# Patient Record
Sex: Female | Born: 1970 | Race: White | Hispanic: No | Marital: Married | Smoking: Former smoker
Health system: Southern US, Community
[De-identification: ages and names within clinical notes are randomized; demographics above are authoritative.]

## PROBLEM LIST (undated history)

## (undated) DIAGNOSIS — R519 Headache, unspecified: Secondary | ICD-10-CM

## (undated) DIAGNOSIS — F419 Anxiety disorder, unspecified: Secondary | ICD-10-CM

## (undated) DIAGNOSIS — R0600 Dyspnea, unspecified: Secondary | ICD-10-CM

## (undated) HISTORY — PX: CHOLECYSTECTOMY: SHX55

## (undated) HISTORY — PX: TONSILLECTOMY: SUR1361

---

## 2005-05-14 ENCOUNTER — Ambulatory Visit: Payer: Self-pay | Admitting: Obstetrics and Gynecology

## 2005-11-19 ENCOUNTER — Ambulatory Visit: Payer: Self-pay | Admitting: Family Medicine

## 2010-10-24 DIAGNOSIS — L409 Psoriasis, unspecified: Secondary | ICD-10-CM | POA: Insufficient documentation

## 2010-10-24 DIAGNOSIS — M542 Cervicalgia: Secondary | ICD-10-CM | POA: Insufficient documentation

## 2010-10-24 DIAGNOSIS — K219 Gastro-esophageal reflux disease without esophagitis: Secondary | ICD-10-CM | POA: Insufficient documentation

## 2010-10-24 DIAGNOSIS — F419 Anxiety disorder, unspecified: Secondary | ICD-10-CM | POA: Insufficient documentation

## 2010-10-24 DIAGNOSIS — F410 Panic disorder [episodic paroxysmal anxiety] without agoraphobia: Secondary | ICD-10-CM | POA: Insufficient documentation

## 2011-04-21 DIAGNOSIS — R0600 Dyspnea, unspecified: Secondary | ICD-10-CM | POA: Insufficient documentation

## 2011-04-21 DIAGNOSIS — L508 Other urticaria: Secondary | ICD-10-CM | POA: Insufficient documentation

## 2013-03-19 ENCOUNTER — Ambulatory Visit: Payer: Self-pay | Admitting: Physician Assistant

## 2013-03-20 ENCOUNTER — Ambulatory Visit: Payer: Self-pay | Admitting: Physician Assistant

## 2013-03-22 ENCOUNTER — Ambulatory Visit: Payer: Self-pay | Admitting: Physician Assistant

## 2015-07-04 ENCOUNTER — Other Ambulatory Visit: Payer: Self-pay | Admitting: Obstetrics and Gynecology

## 2015-07-04 DIAGNOSIS — Z1231 Encounter for screening mammogram for malignant neoplasm of breast: Secondary | ICD-10-CM

## 2015-07-19 ENCOUNTER — Ambulatory Visit: Payer: Self-pay

## 2015-12-17 DIAGNOSIS — M940 Chondrocostal junction syndrome [Tietze]: Secondary | ICD-10-CM | POA: Insufficient documentation

## 2016-07-16 ENCOUNTER — Ambulatory Visit: Payer: BLUE CROSS/BLUE SHIELD | Admitting: Physical Therapy

## 2016-07-24 ENCOUNTER — Encounter: Payer: BLUE CROSS/BLUE SHIELD | Admitting: Physical Therapy

## 2016-07-30 ENCOUNTER — Encounter: Payer: BLUE CROSS/BLUE SHIELD | Admitting: Physical Therapy

## 2016-08-04 ENCOUNTER — Encounter: Payer: BLUE CROSS/BLUE SHIELD | Admitting: Physical Therapy

## 2016-08-18 ENCOUNTER — Encounter: Payer: BLUE CROSS/BLUE SHIELD | Admitting: Physical Therapy

## 2016-09-01 ENCOUNTER — Encounter: Payer: BLUE CROSS/BLUE SHIELD | Admitting: Physical Therapy

## 2016-09-15 ENCOUNTER — Encounter: Payer: BLUE CROSS/BLUE SHIELD | Admitting: Physical Therapy

## 2016-09-30 ENCOUNTER — Encounter: Payer: BLUE CROSS/BLUE SHIELD | Admitting: Physical Therapy

## 2016-10-28 ENCOUNTER — Ambulatory Visit
Admission: RE | Admit: 2016-10-28 | Payer: BLUE CROSS/BLUE SHIELD | Source: Ambulatory Visit | Admitting: Gastroenterology

## 2016-10-28 ENCOUNTER — Encounter: Admission: RE | Payer: Self-pay | Source: Ambulatory Visit

## 2016-10-28 SURGERY — ESOPHAGOGASTRODUODENOSCOPY (EGD) WITH PROPOFOL
Anesthesia: General

## 2016-11-04 ENCOUNTER — Other Ambulatory Visit: Payer: Self-pay | Admitting: Internal Medicine

## 2016-11-04 DIAGNOSIS — R1011 Right upper quadrant pain: Secondary | ICD-10-CM

## 2016-11-17 ENCOUNTER — Encounter
Admission: RE | Admit: 2016-11-17 | Discharge: 2016-11-17 | Disposition: A | Discharge status: Home / Self Care | Payer: BLUE CROSS/BLUE SHIELD | Source: Ambulatory Visit | Attending: Internal Medicine | Admitting: Internal Medicine

## 2016-11-17 DIAGNOSIS — R1011 Right upper quadrant pain: Secondary | ICD-10-CM

## 2016-11-17 MED ORDER — TECHNETIUM TC 99M MEBROFENIN IV KIT
5.0000 | PACK | Freq: Once | INTRAVENOUS | Status: AC | PRN
  Administered 2016-11-17: 5.091 via INTRAVENOUS

## 2017-01-07 ENCOUNTER — Other Ambulatory Visit: Payer: Self-pay | Admitting: Gastroenterology

## 2017-01-07 DIAGNOSIS — R1011 Right upper quadrant pain: Secondary | ICD-10-CM

## 2017-01-12 ENCOUNTER — Ambulatory Visit
Admission: RE | Admit: 2017-01-12 | Discharge: 2017-01-12 | Disposition: A | Discharge status: Home / Self Care | Payer: BLUE CROSS/BLUE SHIELD | Source: Ambulatory Visit | Attending: Gastroenterology | Admitting: Gastroenterology

## 2017-01-12 DIAGNOSIS — R1011 Right upper quadrant pain: Secondary | ICD-10-CM | POA: Diagnosis not present

## 2017-01-12 DIAGNOSIS — K760 Fatty (change of) liver, not elsewhere classified: Secondary | ICD-10-CM | POA: Insufficient documentation

## 2017-09-15 ENCOUNTER — Other Ambulatory Visit: Payer: Self-pay | Admitting: Obstetrics and Gynecology

## 2017-09-15 DIAGNOSIS — N6310 Unspecified lump in the right breast, unspecified quadrant: Secondary | ICD-10-CM

## 2017-10-05 ENCOUNTER — Ambulatory Visit
Admission: RE | Admit: 2017-10-05 | Discharge: 2017-10-05 | Disposition: A | Discharge status: Home / Self Care | Payer: BLUE CROSS/BLUE SHIELD | Source: Ambulatory Visit | Attending: Obstetrics and Gynecology | Admitting: Obstetrics and Gynecology

## 2017-10-05 ENCOUNTER — Encounter: Payer: Self-pay | Admitting: Radiology

## 2017-10-05 DIAGNOSIS — N6310 Unspecified lump in the right breast, unspecified quadrant: Secondary | ICD-10-CM | POA: Diagnosis present

## 2018-05-03 ENCOUNTER — Ambulatory Visit
Admission: EM | Admit: 2018-05-03 | Discharge: 2018-05-03 | Disposition: A | Discharge status: Home / Self Care | Payer: BLUE CROSS/BLUE SHIELD | Attending: Family Medicine | Admitting: Family Medicine

## 2018-05-03 ENCOUNTER — Other Ambulatory Visit: Payer: Self-pay

## 2018-05-03 ENCOUNTER — Encounter: Payer: Self-pay | Admitting: Emergency Medicine

## 2018-05-03 ENCOUNTER — Ambulatory Visit (INDEPENDENT_AMBULATORY_CARE_PROVIDER_SITE_OTHER): Payer: BLUE CROSS/BLUE SHIELD

## 2018-05-03 DIAGNOSIS — M940 Chondrocostal junction syndrome [Tietze]: Secondary | ICD-10-CM

## 2018-05-03 DIAGNOSIS — R05 Cough: Secondary | ICD-10-CM

## 2018-05-03 DIAGNOSIS — R1010 Upper abdominal pain, unspecified: Secondary | ICD-10-CM

## 2018-05-03 DIAGNOSIS — R0602 Shortness of breath: Secondary | ICD-10-CM | POA: Diagnosis not present

## 2018-05-03 DIAGNOSIS — F411 Generalized anxiety disorder: Secondary | ICD-10-CM

## 2018-05-03 DIAGNOSIS — Z87891 Personal history of nicotine dependence: Secondary | ICD-10-CM

## 2018-05-03 HISTORY — DX: Anxiety disorder, unspecified: F41.9

## 2018-05-03 MED ORDER — PREDNISONE 10 MG (21) PO TBPK: ORAL_TABLET | Freq: Every day | ORAL | 0 refills | Status: DC

## 2018-05-03 NOTE — ED Provider Notes (Signed)
[facility]    CSN: [id number] Arrival date & time: 05/03/18  1254     History   Chief Complaint Chief Complaint  Patient presents with  . Shortness of Breath    APPT  . Cough    HPI Loretta Dalton is a 48 y.o. female.   48 year old female presents with shortness of breath that she has noticed over the past week. Feels like she has pressure just above her abdomen and along her ribs that is causing her to have difficulty breathing. Shortness of breath is worse when she lies down or leans over. Denies any fever, wheezing, upper chest pain, or swelling in her legs. No dizziness or syncope. She does have a history of fatty liver and gallbladder disease and uncertain if her certain symptoms are related. She has not taken any medications for symptoms. No history of asthma. No previous inhaler use. Has had an intermittent cough for over 3 months. Does have a history of costochondritis, intermittent chest pain that she has had and been evaluated for multiple times in the past 7 years and anxiety. Has been recommended for stress tests and further evaluation but patient either refuses due to financial reasons or anxiety. Has used prednisone in the past for similar symptoms with success. Former smoker (quit 5 years ago). No HTN but elevated cholesterol level. Father has history of CAD in his 1's. Mom with heart failure. Patient under increased stress lately due to home situation and fear of Coronavirus.   The history is provided by the patient.    Past Medical History:  Diagnosis Date  . Anxiety     There are no active problems to display for this patient.   Past Surgical History:  Procedure Laterality Date  . CESAREAN SECTION    . TONSILLECTOMY      OB History   No obstetric history on file.      Home Medications    Prior to Admission medications   Medication Sig Start Date End Date Taking? Authorizing Provider  predniSONE (STERAPRED UNI-PAK 21 TAB) 10 MG (21)  TBPK tablet Take by mouth daily. Take 6 tabs by mouth daily  for 2 days, then decrease by 1 tablet every 2 days until finished on day 12. 05/03/18   Andrell Tallman, Nicholes Stairs, NP    Family History Family History  Problem Relation Age of Onset  . Irritable bowel syndrome Mother   . Heart failure Mother   . Supraventricular tachycardia Mother   . Hyperlipidemia Mother   . Heart attack Father 52  . Breast cancer Neg Hx     Social History Social History   Tobacco Use  . Smoking status: Former Smoker    Last attempt to quit: 05/03/2003    Years since quitting: 15.0  . Smokeless tobacco: Never Used  Substance Use Topics  . Alcohol use: Yes    Alcohol/week: 12.0 standard drinks    Types: 12 Cans of beer per week  . Drug use: Never     Allergies   Augmentin [amoxicillin-pot clavulanate] and Neosporin scar solution [silicone]   Review of Systems Review of Systems  Constitutional: Positive for activity change and fatigue. Negative for appetite change, chills, diaphoresis, fever and unexpected weight change.  HENT: Negative for congestion, ear discharge, ear pain, facial swelling, mouth sores, nosebleeds, postnasal drip, rhinorrhea, sinus pressure, sinus pain, sneezing, sore throat and trouble swallowing.   Eyes: Negative for photophobia and visual disturbance.  Respiratory: Positive for cough and  shortness of breath. Negative for chest tightness, wheezing and stridor.   Cardiovascular: Positive for chest pain (lower). Negative for palpitations and leg swelling.  Gastrointestinal: Positive for abdominal pain (central upper). Negative for diarrhea, nausea and vomiting.  Genitourinary: Negative for decreased urine volume, difficulty urinating, frequency and hematuria.  Musculoskeletal: Negative for arthralgias, back pain, joint swelling and myalgias.  Skin: Positive for rash (recurrent psoriosis). Negative for color change and wound.  Neurological: Negative for dizziness, tremors, seizures,  syncope, speech difficulty, weakness, light-headedness, numbness and headaches.  Hematological: Negative for adenopathy. Does not bruise/bleed easily.  Psychiatric/Behavioral: The patient is nervous/anxious.      Physical Exam Triage Vital Signs ED Triage Vitals  Enc Vitals Group     BP 05/03/18 1310 133/88     Pulse Rate 05/03/18 1310 94     Resp 05/03/18 1310 16     Temp 05/03/18 1310 97.8 F (36.6 C)     Temp Source 05/03/18 1310 Oral     SpO2 05/03/18 1310 100 %     Weight 05/03/18 1311 180 lb (81.6 kg)     Height 05/03/18 1311 5\' 1"  (1.549 m)     Head Circumference --      Peak Flow --      Pain Score 05/03/18 1309 3     Pain Loc --      Pain Edu? --      Excl. in [place]? --    No data found.  Updated Vital Signs BP 133/88 (BP Location: Left Arm)   Pulse 94   Temp 97.8 F (36.6 C) (Oral)   Resp 16   Ht 5\' 1"  (1.549 m)   Wt 180 lb (81.6 kg)   LMP 02/01/2018   SpO2 100%   BMI 34.01 kg/m   Visual Acuity Right Eye Distance:   Left Eye Distance:   Bilateral Distance:    Right Eye Near:   Left Eye Near:    Bilateral Near:     Physical Exam Vitals signs and nursing note reviewed.  Constitutional:      General: She is awake. She is not in acute distress.    Appearance: She is well-developed, well-groomed and overweight. She is not ill-appearing.     Comments: Patient resting comfortably on exam table in no acute distress but appears anxious  HENT:     Head: Normocephalic and atraumatic.     Right Ear: Hearing, tympanic membrane, ear canal and external ear normal.     Left Ear: Hearing, tympanic membrane, ear canal and external ear normal.     Nose: Nose normal.     Mouth/Throat:     Lips: Pink.     Mouth: Mucous membranes are moist.     Pharynx: Oropharynx is clear. Uvula midline. No pharyngeal swelling, oropharyngeal exudate, posterior oropharyngeal erythema or uvula swelling.  Eyes:     Extraocular Movements: Extraocular movements intact.      Conjunctiva/sclera: Conjunctivae normal.     Pupils: Pupils are equal, round, and reactive to light.  Neck:     Musculoskeletal: Normal range of motion and neck supple. No neck rigidity.     Vascular: Normal carotid pulses. No carotid bruit, hepatojugular reflux or JVD.     Trachea: Trachea normal.  Cardiovascular:     Rate and Rhythm: Normal rate and regular rhythm.     Pulses: Normal pulses.     Heart sounds: Normal heart sounds. No murmur.  Pulmonary:     Effort: Pulmonary effort is  normal. No accessory muscle usage, respiratory distress or retractions.     Breath sounds: Normal breath sounds and air entry. No stridor or decreased air movement. No decreased breath sounds, wheezing, rhonchi or rales.  Chest:     Chest wall: Tenderness (bilateral along lower rib cage) present.    Abdominal:     General: Bowel sounds are normal. There is no distension or abdominal bruit.     Palpations: Abdomen is soft. There is no mass.     Tenderness: There is abdominal tenderness in the right upper quadrant and epigastric area. There is no right CVA tenderness, left CVA tenderness, guarding or rebound.     Hernia: No hernia is present.  Musculoskeletal: Normal range of motion.  Lymphadenopathy:     Cervical: No cervical adenopathy.  Skin:    General: Skin is warm and dry.     Capillary Refill: Capillary refill takes less than 2 seconds.  Neurological:     General: No focal deficit present.     Mental Status: She is alert and oriented to person, place, and time.     Cranial Nerves: Cranial nerves are intact.     Sensory: Sensation is intact.     Motor: Motor function is intact.     Gait: Gait is intact.  Psychiatric:        Attention and Perception: Attention normal.        Mood and Affect: Mood is anxious. Affect is tearful.        Speech: Speech normal.        Behavior: Behavior is cooperative.        Thought Content: Thought content normal. Thought content does not include homicidal or  suicidal ideation.        Cognition and Memory: Cognition normal.      UC Treatments / Results  Labs (all labs ordered are listed, but only abnormal results are displayed) Labs Reviewed - No data to display  EKG None  Radiology Dg Chest 2 View  Result Date: 05/03/2018 CLINICAL DATA:  Short of breath and cough EXAM: CHEST - 2 VIEW COMPARISON:  None. FINDINGS: The heart size and mediastinal contours are within normal limits. Both lungs are clear. The visualized skeletal structures are unremarkable. IMPRESSION: No active cardiopulmonary disease. Electronically Signed   By: Franchot Gallo M.D.   On: 05/03/2018 14:02    Procedures Procedures (including critical care time)  Medications Ordered in UC Medications - No data to display  Initial Impression / Assessment and Plan / UC Course  I have reviewed the triage vital signs and the nursing notes.  Pertinent labs & imaging results that were available during my care of the patient were reviewed by me and considered in my medical decision making (see chart for details).    Reviewed chest x-ray results with patient- no fluid, pneumonia or enlarged heart. Discussed that symptoms may be costochondritis with anxiety component. Doubt cardiac etiology but patient should have further work-up though her PCP. Recommend trial Prednisone 10mg  12 day dose pack as directed. Offered to provide her with Vistaril to take prn for anxiety but patient got very nervous and "did not want to take any new medication. If similar to Benadryl, it makes me mean and I don't feel right". Also uncertain if limited exercise and poor conditioning is contributing to her shortness of breath. Encouraged to eat more fruits and vegetables and less fried and fatty foods to help with occasional abdominal pain. Encouraged to try to slowly  increase walking and exercise. Recommend relaxation techniques to deal with anxiety. Note written for work for today. Follow-up with her PCP in 3  to 4 days if minimal improvement or go to the ER ASAP if symptoms worsen.  Final Clinical Impressions(s) / UC Diagnoses   Final diagnoses:  Shortness of breath  Costochondritis  Anxiety state  Intermittent upper abdominal pain     Discharge Instructions     Recommend start Prednisone 10mg  tablets- take 6 tablets today and tomorrow and then decrease by 1 tablet every 2 days until finished on day 12. Continue to use relaxation techniques to help deal with anxiety. Recommend follow-up with your PCP if not improving within 3 to 4 days or go to the ER ASAP if symptoms worsen.     ED Prescriptions    Medication Sig Dispense Auth. Provider   predniSONE (STERAPRED UNI-PAK 21 TAB) 10 MG (21) TBPK tablet Take by mouth daily. Take 6 tabs by mouth daily  for 2 days, then decrease by 1 tablet every 2 days until finished on day 12. 42 tablet Faiga Stones, Nicholes Stairs, NP     Controlled Substance Prescriptions [place] Controlled Substance Registry consulted? Not Applicable   Katy Apo, NP 05/04/18 1133

## 2018-05-03 NOTE — ED Triage Notes (Signed)
Patient in today c/o sob x 1 week off & on x 1 week. Patient states her anxiety makes her sob worse. Patient does have a slight cough from illness at Christmas time. Patient c/o rib pain and abdominal pain. Patient has a history of costochondritis.

## 2018-05-03 NOTE — Discharge Instructions (Addendum)
Recommend start Prednisone 10mg  tablets- take 6 tablets today and tomorrow and then decrease by 1 tablet every 2 days until finished on day 12. Continue to use relaxation techniques to help deal with anxiety. Recommend follow-up with your PCP if not improving within 3 to 4 days or go to the ER ASAP if symptoms worsen.

## 2019-02-03 ENCOUNTER — Other Ambulatory Visit: Payer: Self-pay | Admitting: Physician Assistant

## 2019-02-03 DIAGNOSIS — Z1231 Encounter for screening mammogram for malignant neoplasm of breast: Secondary | ICD-10-CM

## 2019-03-03 ENCOUNTER — Other Ambulatory Visit: Payer: Self-pay

## 2019-03-03 ENCOUNTER — Ambulatory Visit
Admission: RE | Admit: 2019-03-03 | Discharge: 2019-03-03 | Disposition: A | Discharge status: Home / Self Care | Payer: 59 | Source: Ambulatory Visit | Attending: Physician Assistant | Admitting: Physician Assistant

## 2019-03-03 DIAGNOSIS — Z1231 Encounter for screening mammogram for malignant neoplasm of breast: Secondary | ICD-10-CM | POA: Diagnosis present

## 2019-08-02 ENCOUNTER — Other Ambulatory Visit: Payer: Self-pay | Admitting: Gastroenterology

## 2019-08-02 DIAGNOSIS — R112 Nausea with vomiting, unspecified: Secondary | ICD-10-CM

## 2019-08-02 DIAGNOSIS — K760 Fatty (change of) liver, not elsewhere classified: Secondary | ICD-10-CM

## 2019-08-10 ENCOUNTER — Ambulatory Visit
Admission: RE | Admit: 2019-08-10 | Discharge: 2019-08-10 | Disposition: A | Discharge status: Home / Self Care | Payer: 59 | Source: Ambulatory Visit | Attending: Gastroenterology | Admitting: Gastroenterology

## 2019-08-10 ENCOUNTER — Encounter
Admission: RE | Admit: 2019-08-10 | Discharge: 2019-08-10 | Disposition: A | Discharge status: Home / Self Care | Payer: 59 | Source: Ambulatory Visit | Attending: Gastroenterology | Admitting: Gastroenterology

## 2019-08-10 ENCOUNTER — Other Ambulatory Visit: Payer: Self-pay

## 2019-08-10 DIAGNOSIS — K760 Fatty (change of) liver, not elsewhere classified: Secondary | ICD-10-CM | POA: Insufficient documentation

## 2019-08-10 DIAGNOSIS — R112 Nausea with vomiting, unspecified: Secondary | ICD-10-CM | POA: Diagnosis not present

## 2019-08-10 MED ORDER — TECHNETIUM TC 99M MEBROFENIN IV KIT
5.0000 | PACK | Freq: Once | INTRAVENOUS | Status: AC | PRN
  Administered 2019-08-10: 5.31 via INTRAVENOUS

## 2019-08-24 ENCOUNTER — Ambulatory Visit: Payer: Self-pay | Admitting: Surgery

## 2019-08-24 NOTE — H&P (View-Only) (Signed)
Subjective:   CC: Biliary dyskinesia [K82.8]  HPI:  Loretta Dalton is a 49 y.o. female who was referred by Fabian Sharp* for evaluation of above CC. Symptoms were first noted several weeks ago. Pain is sharp and intermittent, confined to the epigastric area and right upper quadrant. Associated with nothing specific, exacerbated by nothing specific.  Noticed increased SOB, but associated with increasing anxiety lately.  Discussed with PCM who also believed it was anxiety related, did not order further workup.  Also complained of increasing epigastric discomfort not alleviated by recent PPI use.     Past Medical History:  has a past medical history of Abnormal Pap smear (49 year old), Fatty liver, GERD (gastroesophageal reflux disease), and Psoriasis.  Past Surgical History:  has a past surgical history that includes Tonsillectomy; Cesarean section (2003); and egd (11/03/2016).  Family History: family history includes Colon cancer in her maternal uncle; Coronary Artery Disease (Blocked arteries around heart) in her father; Heart disease in her father and paternal grandmother; Irritable bowel syndrome in her mother; Liver cancer in her maternal uncle; Prostate cancer in her maternal grandfather; Supraventricular tachycardia in her mother.  Social History:  reports that she quit smoking about 20 years ago. She has a 5.00 pack-year smoking history. She has never used smokeless tobacco. She reports current alcohol use of about 3.0 - 4.0 standard drinks of alcohol per week. She reports that she does not use drugs.  Current Medications: has a current medication list which includes the following prescription(s): acetylcysteine, alprazolam, ascorbic acid/collagen hydr, calcium carbonate-vitamin d3, clobetasol, cyanocobalamin, cyclobenzaprine hcl, etodolac, herbal supplement, magnesium oxide, milk thistle, pantoprazole, turmeric, vitamin e, and escitalopram oxalate.  Allergies:  Allergies as  of 08/24/2019 - Reviewed 08/24/2019  Allergen Reaction Noted  . Neosporin [hydrocortisone] Rash 10/25/2010  . Celebrex [celecoxib] Other (See Comments) 05/05/2019  . Augmentin [amoxicillin-pot clavulanate] Vomiting   . Silicone Rash 53/66/4403    ROS:  A 15 point review of systems was performed and pertinent positives and negatives noted in HPI    Objective:     BP 115/86   Pulse 93   Ht 156.2 cm (5' 1.5")   Wt 85.3 kg (188 lb)   BMI 34.95 kg/m    Constitutional :  alert, appears stated age, cooperative and no distress  Lymphatics/Throat:  no asymmetry, masses, or scars  Respiratory:  clear to auscultation bilaterally  Cardiovascular:  regular rate and rhythm  Gastrointestinal: soft, non-tender; bowel sounds normal; no masses,  no organomegaly.    Musculoskeletal: Steady gait and movement  Skin: Cool and moist  Psychiatric: Normal affect, non-agitated, not confused       LABS:  n/a   RADS: CLINICAL DATA: Abdominal discomfort and nausea and vomiting for  approximately 6 weeks.   EXAM:  NUCLEAR MEDICINE HEPATOBILIARY IMAGING WITH GALLBLADDER EF   TECHNIQUE:  Sequential images of the abdomen were obtained out to 60 minutes  following intravenous administration of radiopharmaceutical. After  oral ingestion of Ensure, gallbladder ejection fraction was  determined. At 60 min, normal ejection fraction is greater than 33%.   RADIOPHARMACEUTICALS: 5.3 mCi Tc-18m Choletec IV   COMPARISON: None.   FINDINGS:  Prompt uptake and biliary excretion of activity by the liver is  seen. Gallbladder activity is visualized, consistent with patency of  cystic duct. Biliary activity passes into small bowel, consistent  with patent common bile duct.   Calculated gallbladder ejection fraction is 8%. (Normal gallbladder  ejection fraction with Ensure is greater than  33%.)   IMPRESSION:  Normal hepatobiliary scan, demonstrating patency of cystic and  common bile ducts.    Decreased gallbladder ejection fraction of 8%.    Electronically Signed  By: Marlaine Hind M.D.  On: 08/10/2019 17:16  CLINICAL DATA: Fatty liver, nausea and vomiting   EXAM:  ABDOMEN ULTRASOUND COMPLETE   COMPARISON: 01/12/2017   FINDINGS:  Gallbladder: No gallstones or wall thickening visualized. No  sonographic Murphy sign noted by sonographer.   Common bile duct: Diameter: 2.4 mm   Liver: Heterogeneous increased echogenicity compatible with hepatic  steatosis. No focal abnormality or intrahepatic biliary dilatation.  Portal vein is patent on color Doppler imaging with normal direction  of blood flow towards the liver.   IVC: No abnormality visualized.   Pancreas: Visualized portion unremarkable.   Spleen: Size and appearance within normal limits.   Right Kidney: Length: 10.2 cm. Normal echogenicity. No  hydronephrosis. Right kidney upper pole exophytic hypoechoic  indeterminate lesion noted measuring 3.2 x 2.3 x 2.2 cm. This may  represent a complex cyst versus solid lesion. Recommend further  evaluation with nonemergent MRI.   Left Kidney: Length: 10.5 cm. Echogenicity within normal limits. No  mass or hydronephrosis visualized.   Abdominal aorta: No aneurysm visualized.   Other findings: No free fluid or ascites   IMPRESSION:  No acute finding by abdominal ultrasound.   Hepatic steatosis   3.2 cm hypoechoic right upper pole renal lesion remains  indeterminate by ultrasound. Recommend further evaluation with  nonemergent MRI without and with contrast.    Electronically Signed  By: Jerilynn Mages. Shick M.D.  On: 08/11/2019 09:15  Assessment:      Biliary dyskinesia [K82.8] symptoms sounds consistent with GB disease.  RUQ Symptoms likely will resolve, hoping her epigastric and "stomach ulcer" issues could potentially be alleviated as well, but no guarantee.  Plan:     1. Biliary dyskinesia [K82.8] Discussed the risk of surgery including post-op  infxn, seroma, biloma, chronic pain, poor-delayed wound healing, retained gallstone, conversion to open procedure, post-op SBO or ileus, and need for additional procedures to address said risks.  The risks of general anesthetic including MI, CVA, sudden death or even reaction to anesthetic medications also discussed. Alternatives include continued observation.  Benefits include possible symptom relief, prevention of complications including acute cholecystitis, pancreatitis.  Typical post operative recovery of 3-5 days rest, continued pain in area and incision sites, possible loose stools up to 4-6 weeks, also discussed.  ED return precautions given for sudden increase in RUQ pain, with possible accompanying fever, nausea, and/or vomiting.  The patient understands the risks, any and all questions were answered to the patient's satisfaction.  2. Patient has elected to proceed with surgical treatment. Procedure will be scheduled.  Written consent was obtained..robotic assisted laparoscopic.  Three days off work should be all she needs.  Pt anxious of procedure and requested additional time off, but reassured her three days typically is enough

## 2019-08-24 NOTE — H&P (Signed)
Subjective:   CC: Biliary dyskinesia [K82.8]  HPI:  Loretta Dalton is a 49 y.o. female who was referred by Fabian Sharp* for evaluation of above CC. Symptoms were first noted several weeks ago. Pain is sharp and intermittent, confined to the epigastric area and right upper quadrant. Associated with nothing specific, exacerbated by nothing specific.  Noticed increased SOB, but associated with increasing anxiety lately.  Discussed with PCM who also believed it was anxiety related, did not order further workup.  Also complained of increasing epigastric discomfort not alleviated by recent PPI use.     Past Medical History:  has a past medical history of Abnormal Pap smear (49 year old), Fatty liver, GERD (gastroesophageal reflux disease), and Psoriasis.  Past Surgical History:  has a past surgical history that includes Tonsillectomy; Cesarean section (2003); and egd (11/03/2016).  Family History: family history includes Colon cancer in her maternal uncle; Coronary Artery Disease (Blocked arteries around heart) in her father; Heart disease in her father and paternal grandmother; Irritable bowel syndrome in her mother; Liver cancer in her maternal uncle; Prostate cancer in her maternal grandfather; Supraventricular tachycardia in her mother.  Social History:  reports that she quit smoking about 20 years ago. She has a 5.00 pack-year smoking history. She has never used smokeless tobacco. She reports current alcohol use of about 3.0 - 4.0 standard drinks of alcohol per week. She reports that she does not use drugs.  Current Medications: has a current medication list which includes the following prescription(s): acetylcysteine, alprazolam, ascorbic acid/collagen hydr, calcium carbonate-vitamin d3, clobetasol, cyanocobalamin, cyclobenzaprine hcl, etodolac, herbal supplement, magnesium oxide, milk thistle, pantoprazole, turmeric, vitamin e, and escitalopram oxalate.  Allergies:  Allergies as  of 08/24/2019 - Reviewed 08/24/2019  Allergen Reaction Noted  . Neosporin [hydrocortisone] Rash 10/25/2010  . Celebrex [celecoxib] Other (See Comments) 05/05/2019  . Augmentin [amoxicillin-pot clavulanate] Vomiting   . Silicone Rash 81/19/1478    ROS:  A 15 point review of systems was performed and pertinent positives and negatives noted in HPI    Objective:     BP 115/86   Pulse 93   Ht 156.2 cm (5' 1.5")   Wt 85.3 kg (188 lb)   BMI 34.95 kg/m    Constitutional :  alert, appears stated age, cooperative and no distress  Lymphatics/Throat:  no asymmetry, masses, or scars  Respiratory:  clear to auscultation bilaterally  Cardiovascular:  regular rate and rhythm  Gastrointestinal: soft, non-tender; bowel sounds normal; no masses,  no organomegaly.    Musculoskeletal: Steady gait and movement  Skin: Cool and moist  Psychiatric: Normal affect, non-agitated, not confused       LABS:  n/a   RADS: CLINICAL DATA: Abdominal discomfort and nausea and vomiting for  approximately 6 weeks.   EXAM:  NUCLEAR MEDICINE HEPATOBILIARY IMAGING WITH GALLBLADDER EF   TECHNIQUE:  Sequential images of the abdomen were obtained out to 60 minutes  following intravenous administration of radiopharmaceutical. After  oral ingestion of Ensure, gallbladder ejection fraction was  determined. At 60 min, normal ejection fraction is greater than 33%.   RADIOPHARMACEUTICALS: 5.3 mCi Tc-66m Choletec IV   COMPARISON: None.   FINDINGS:  Prompt uptake and biliary excretion of activity by the liver is  seen. Gallbladder activity is visualized, consistent with patency of  cystic duct. Biliary activity passes into small bowel, consistent  with patent common bile duct.   Calculated gallbladder ejection fraction is 8%. (Normal gallbladder  ejection fraction with Ensure is greater than  33%.)   IMPRESSION:  Normal hepatobiliary scan, demonstrating patency of cystic and  common bile ducts.    Decreased gallbladder ejection fraction of 8%.    Electronically Signed  By: Marlaine Hind M.D.  On: 08/10/2019 17:16  CLINICAL DATA: Fatty liver, nausea and vomiting   EXAM:  ABDOMEN ULTRASOUND COMPLETE   COMPARISON: 01/12/2017   FINDINGS:  Gallbladder: No gallstones or wall thickening visualized. No  sonographic Murphy sign noted by sonographer.   Common bile duct: Diameter: 2.4 mm   Liver: Heterogeneous increased echogenicity compatible with hepatic  steatosis. No focal abnormality or intrahepatic biliary dilatation.  Portal vein is patent on color Doppler imaging with normal direction  of blood flow towards the liver.   IVC: No abnormality visualized.   Pancreas: Visualized portion unremarkable.   Spleen: Size and appearance within normal limits.   Right Kidney: Length: 10.2 cm. Normal echogenicity. No  hydronephrosis. Right kidney upper pole exophytic hypoechoic  indeterminate lesion noted measuring 3.2 x 2.3 x 2.2 cm. This may  represent a complex cyst versus solid lesion. Recommend further  evaluation with nonemergent MRI.   Left Kidney: Length: 10.5 cm. Echogenicity within normal limits. No  mass or hydronephrosis visualized.   Abdominal aorta: No aneurysm visualized.   Other findings: No free fluid or ascites   IMPRESSION:  No acute finding by abdominal ultrasound.   Hepatic steatosis   3.2 cm hypoechoic right upper pole renal lesion remains  indeterminate by ultrasound. Recommend further evaluation with  nonemergent MRI without and with contrast.    Electronically Signed  By: Jerilynn Mages. Shick M.D.  On: 08/11/2019 09:15  Assessment:      Biliary dyskinesia [K82.8] symptoms sounds consistent with GB disease.  RUQ Symptoms likely will resolve, hoping her epigastric and "stomach ulcer" issues could potentially be alleviated as well, but no guarantee.  Plan:     1. Biliary dyskinesia [K82.8] Discussed the risk of surgery including post-op  infxn, seroma, biloma, chronic pain, poor-delayed wound healing, retained gallstone, conversion to open procedure, post-op SBO or ileus, and need for additional procedures to address said risks.  The risks of general anesthetic including MI, CVA, sudden death or even reaction to anesthetic medications also discussed. Alternatives include continued observation.  Benefits include possible symptom relief, prevention of complications including acute cholecystitis, pancreatitis.  Typical post operative recovery of 3-5 days rest, continued pain in area and incision sites, possible loose stools up to 4-6 weeks, also discussed.  ED return precautions given for sudden increase in RUQ pain, with possible accompanying fever, nausea, and/or vomiting.  The patient understands the risks, any and all questions were answered to the patient's satisfaction.  2. Patient has elected to proceed with surgical treatment. Procedure will be scheduled.  Written consent was obtained..robotic assisted laparoscopic.  Three days off work should be all she needs.  Pt anxious of procedure and requested additional time off, but reassured her three days typically is enough

## 2019-08-30 ENCOUNTER — Encounter
Admission: RE | Admit: 2019-08-30 | Discharge: 2019-08-30 | Disposition: A | Discharge status: Home / Self Care | Payer: 59 | Source: Ambulatory Visit | Attending: Surgery | Admitting: Surgery

## 2019-08-30 ENCOUNTER — Other Ambulatory Visit: Payer: Self-pay

## 2019-08-30 HISTORY — DX: Headache, unspecified: R51.9

## 2019-08-30 HISTORY — DX: Dyspnea, unspecified: R06.00

## 2019-08-30 NOTE — Patient Instructions (Signed)
Your procedure is scheduled on: Thursday September 01, 2019. Report to Day Surgery inside [facility] 2nd floor. To find out your arrival time please call [phone] between 1PM - 3PM on Wednesday August 31, 2019.  Remember: Instructions that are not followed completely may result in serious medical risk,  up to and including death, or upon the discretion of your surgeon and anesthesiologist your  surgery may need to be rescheduled.     _X__ 1. Do not eat food after midnight the night before your procedure.                 No gum chewing or hard candies. You may drink clear liquids up to 2 hours                 before you are scheduled to arrive for your surgery- DO not drink clear                 liquids within 2 hours of the start of your surgery.                 Clear Liquids include:  water, apple juice without pulp, clear Gatorade, G2 or                  Gatorade Zero (avoid Red/Purple/Blue), Black Coffee or Tea (Do not add                 anything to coffee or tea).  __X__2.  On the morning of surgery brush your teeth with toothpaste and water, you                may rinse your mouth with mouthwash if you wish.  Do not swallow any toothpaste of mouthwash.     _X__ 3.  No Alcohol for 24 hours before or after surgery.   _X__ 4.  Do Not Smoke or use e-cigarettes For 24 Hours Prior to Your Surgery.                 Do not use any chewable tobacco products for at least 6 hours prior to                 Surgery.  _X__  5.  Do not use any recreational drugs (marijuana, cocaine, heroin, ecstacy, MDMA or other)                For at least one week prior to your surgery.  Combination of these drugs with anesthesia                May have life threatening results.  __X__ 6.  Notify your doctor if there is any change in your medical condition      (cold, fever, infections).     Do not wear jewelry, make-up, hairpins, clips or nail polish. Do not wear lotions, powders, or  perfumes. You may wear deodorant. Do not shave 48 hours prior to surgery. Men may shave face and neck. Do not bring valuables to the hospital.    [facility] is not responsible for any belongings or valuables.  Contacts, dentures or bridgework may not be worn into surgery. Leave your suitcase in the car. After surgery it may be brought to your room. For patients admitted to the hospital, discharge time is determined by your treatment team.   Patients discharged the day of surgery will not be allowed to drive home.   Make arrangements for someone to be  with you for the first 24 hours of your Same Day Discharge.  __X__ Take these medicines the morning of surgery with A SIP OF WATER:    1. pantoprazole (PROTONIX) 40 MG   __X__ Use CHG Soap (or wipes) as directed  __X__ Stop Anti-inflammatories such as etodolac (LODINE), Ibuprofen, Aleve, Advil, naproxen, aspirin and or BC powders   __X__ Stop supplements until after surgery.    __X__ Do not start any herbal supplements before your surgery.

## 2019-08-31 ENCOUNTER — Other Ambulatory Visit
Admission: RE | Admit: 2019-08-31 | Discharge: 2019-08-31 | Disposition: A | Discharge status: Home / Self Care | Payer: 59 | Source: Ambulatory Visit | Attending: Surgery | Admitting: Surgery

## 2019-08-31 DIAGNOSIS — Z01812 Encounter for preprocedural laboratory examination: Secondary | ICD-10-CM | POA: Diagnosis present

## 2019-08-31 DIAGNOSIS — Z20822 Contact with and (suspected) exposure to covid-19: Secondary | ICD-10-CM | POA: Insufficient documentation

## 2019-08-31 LAB — SARS CORONAVIRUS 2 (TAT 6-24 HRS): SARS Coronavirus 2: NEGATIVE

## 2019-09-01 ENCOUNTER — Encounter: Payer: Self-pay | Admitting: Surgery

## 2019-09-01 ENCOUNTER — Ambulatory Visit: Payer: 59 | Admitting: Certified Registered Nurse Anesthetist

## 2019-09-01 ENCOUNTER — Encounter
Admission: RE | Disposition: A | Discharge status: Home / Self Care | Payer: Self-pay | Source: Home / Self Care | Attending: Surgery

## 2019-09-01 ENCOUNTER — Other Ambulatory Visit: Payer: Self-pay

## 2019-09-01 ENCOUNTER — Ambulatory Visit
Admission: RE | Admit: 2019-09-01 | Discharge: 2019-09-01 | Disposition: A | Discharge status: Home / Self Care | Payer: 59 | Attending: Surgery | Admitting: Surgery

## 2019-09-01 DIAGNOSIS — F419 Anxiety disorder, unspecified: Secondary | ICD-10-CM | POA: Insufficient documentation

## 2019-09-01 DIAGNOSIS — K259 Gastric ulcer, unspecified as acute or chronic, without hemorrhage or perforation: Secondary | ICD-10-CM | POA: Diagnosis not present

## 2019-09-01 DIAGNOSIS — Z79899 Other long term (current) drug therapy: Secondary | ICD-10-CM | POA: Diagnosis not present

## 2019-09-01 DIAGNOSIS — Z880 Allergy status to penicillin: Secondary | ICD-10-CM | POA: Insufficient documentation

## 2019-09-01 DIAGNOSIS — K828 Other specified diseases of gallbladder: Secondary | ICD-10-CM | POA: Diagnosis present

## 2019-09-01 DIAGNOSIS — Z881 Allergy status to other antibiotic agents status: Secondary | ICD-10-CM | POA: Diagnosis not present

## 2019-09-01 DIAGNOSIS — Z886 Allergy status to analgesic agent status: Secondary | ICD-10-CM | POA: Diagnosis not present

## 2019-09-01 DIAGNOSIS — K8010 Calculus of gallbladder with chronic cholecystitis without obstruction: Secondary | ICD-10-CM | POA: Diagnosis not present

## 2019-09-01 DIAGNOSIS — Z87891 Personal history of nicotine dependence: Secondary | ICD-10-CM | POA: Diagnosis not present

## 2019-09-01 DIAGNOSIS — K219 Gastro-esophageal reflux disease without esophagitis: Secondary | ICD-10-CM | POA: Insufficient documentation

## 2019-09-01 DIAGNOSIS — K760 Fatty (change of) liver, not elsewhere classified: Secondary | ICD-10-CM | POA: Diagnosis not present

## 2019-09-01 SURGERY — CHOLECYSTECTOMY, ROBOT-ASSISTED, LAPAROSCOPIC
Anesthesia: General | Site: Abdomen

## 2019-09-01 MED ORDER — MIDAZOLAM HCL 2 MG/2ML IJ SOLN
INTRAMUSCULAR | Status: AC
  Filled 2019-09-01: qty 2

## 2019-09-01 MED ORDER — LIDOCAINE-EPINEPHRINE (PF) 1 %-1:200000 IJ SOLN
INTRAMUSCULAR | Status: AC
  Filled 2019-09-01: qty 30

## 2019-09-01 MED ORDER — ACETAMINOPHEN 500 MG PO TABS
ORAL_TABLET | ORAL | Status: AC
  Administered 2019-09-01: 1000 mg via ORAL
  Filled 2019-09-01: qty 2

## 2019-09-01 MED ORDER — OXYCODONE HCL 5 MG PO TABS
ORAL_TABLET | ORAL | Status: AC
  Filled 2019-09-01: qty 1

## 2019-09-01 MED ORDER — CEFAZOLIN SODIUM-DEXTROSE 2-4 GM/100ML-% IV SOLN
INTRAVENOUS | Status: AC
  Filled 2019-09-01: qty 100

## 2019-09-01 MED ORDER — CELECOXIB 200 MG PO CAPS: 200.0000 mg | ORAL_CAPSULE | ORAL | Status: DC

## 2019-09-01 MED ORDER — FENTANYL CITRATE (PF) 100 MCG/2ML IJ SOLN
INTRAMUSCULAR | Status: AC
  Filled 2019-09-01: qty 2

## 2019-09-01 MED ORDER — INDOCYANINE GREEN 25 MG IV SOLR
1.2500 mg | Freq: Once | INTRAVENOUS | Status: AC
  Administered 2019-09-01: 1.25 mg via INTRAVENOUS
  Filled 2019-09-01: qty 10

## 2019-09-01 MED ORDER — SUCCINYLCHOLINE CHLORIDE 20 MG/ML IJ SOLN
INTRAMUSCULAR | Status: DC | PRN
  Administered 2019-09-01: 100 mg via INTRAVENOUS

## 2019-09-01 MED ORDER — GABAPENTIN 300 MG PO CAPS
ORAL_CAPSULE | ORAL | Status: AC
  Administered 2019-09-01: 300 mg via ORAL
  Filled 2019-09-01: qty 1

## 2019-09-01 MED ORDER — OXYCODONE HCL 5 MG PO TABS: 5.0000 mg | ORAL_TABLET | Freq: Once | ORAL | Status: AC

## 2019-09-01 MED ORDER — CHLORHEXIDINE GLUCONATE CLOTH 2 % EX PADS: 6.0000 | MEDICATED_PAD | Freq: Once | CUTANEOUS | Status: DC

## 2019-09-01 MED ORDER — LIDOCAINE HCL (PF) 2 % IJ SOLN
INTRAMUSCULAR | Status: AC
  Filled 2019-09-01: qty 5

## 2019-09-01 MED ORDER — SUGAMMADEX SODIUM 200 MG/2ML IV SOLN
INTRAVENOUS | Status: DC | PRN
  Administered 2019-09-01: 200 mg via INTRAVENOUS

## 2019-09-01 MED ORDER — CEFAZOLIN SODIUM-DEXTROSE 2-4 GM/100ML-% IV SOLN
2.0000 g | INTRAVENOUS | Status: AC
  Administered 2019-09-01: 2 g via INTRAVENOUS

## 2019-09-01 MED ORDER — PROPOFOL 10 MG/ML IV BOLUS
INTRAVENOUS | Status: DC | PRN
  Administered 2019-09-01: 150 mg via INTRAVENOUS
  Administered 2019-09-01: 50 mg via INTRAVENOUS

## 2019-09-01 MED ORDER — GABAPENTIN 300 MG PO CAPS: 300.0000 mg | ORAL_CAPSULE | ORAL | Status: AC

## 2019-09-01 MED ORDER — HYDROCODONE-ACETAMINOPHEN 5-325 MG PO TABS: 1.0000 | ORAL_TABLET | Freq: Four times a day (QID) | ORAL | 0 refills | Status: DC | PRN

## 2019-09-01 MED ORDER — ONDANSETRON HCL 4 MG/2ML IJ SOLN
INTRAMUSCULAR | Status: AC
  Filled 2019-09-01: qty 2

## 2019-09-01 MED ORDER — OXYCODONE HCL 5 MG/5ML PO SOLN: 5.0000 mg | Freq: Once | ORAL | Status: AC | PRN

## 2019-09-01 MED ORDER — BUPIVACAINE HCL (PF) 0.5 % IJ SOLN
INTRAMUSCULAR | Status: AC
  Filled 2019-09-01: qty 30

## 2019-09-01 MED ORDER — PROPOFOL 10 MG/ML IV BOLUS
INTRAVENOUS | Status: AC
  Filled 2019-09-01: qty 20

## 2019-09-01 MED ORDER — LIDOCAINE HCL (CARDIAC) PF 100 MG/5ML IV SOSY
PREFILLED_SYRINGE | INTRAVENOUS | Status: DC | PRN
  Administered 2019-09-01: 80 mg via INTRAVENOUS

## 2019-09-01 MED ORDER — ORAL CARE MOUTH RINSE: 15.0000 mL | Freq: Once | OROMUCOSAL | Status: AC

## 2019-09-01 MED ORDER — MIDAZOLAM HCL 2 MG/2ML IJ SOLN
INTRAMUSCULAR | Status: DC | PRN
  Administered 2019-09-01: 2 mg via INTRAVENOUS

## 2019-09-01 MED ORDER — ROCURONIUM BROMIDE 100 MG/10ML IV SOLN
INTRAVENOUS | Status: DC | PRN
  Administered 2019-09-01: 50 mg via INTRAVENOUS

## 2019-09-01 MED ORDER — DOCUSATE SODIUM 100 MG PO CAPS: 100.0000 mg | ORAL_CAPSULE | Freq: Two times a day (BID) | ORAL | 0 refills | Status: AC | PRN

## 2019-09-01 MED ORDER — CHLORHEXIDINE GLUCONATE 0.12 % MT SOLN
OROMUCOSAL | Status: AC
  Administered 2019-09-01: 15 mL via OROMUCOSAL
  Filled 2019-09-01: qty 15

## 2019-09-01 MED ORDER — ONDANSETRON HCL 4 MG/2ML IJ SOLN: 4.0000 mg | Freq: Once | INTRAMUSCULAR | Status: DC

## 2019-09-01 MED ORDER — LIDOCAINE-EPINEPHRINE (PF) 1 %-1:200000 IJ SOLN
INTRAMUSCULAR | Status: DC | PRN
  Administered 2019-09-01: 28 mL via INTRAMUSCULAR

## 2019-09-01 MED ORDER — ONDANSETRON HCL 4 MG/2ML IJ SOLN
INTRAMUSCULAR | Status: DC | PRN
  Administered 2019-09-01: 4 mg via INTRAVENOUS

## 2019-09-01 MED ORDER — FENTANYL CITRATE (PF) 100 MCG/2ML IJ SOLN
INTRAMUSCULAR | Status: DC | PRN
  Administered 2019-09-01 (×4): 50 ug via INTRAVENOUS

## 2019-09-01 MED ORDER — EPHEDRINE SULFATE 50 MG/ML IJ SOLN
INTRAMUSCULAR | Status: DC | PRN
  Administered 2019-09-01 (×3): 5 mg via INTRAVENOUS

## 2019-09-01 MED ORDER — FENTANYL CITRATE (PF) 100 MCG/2ML IJ SOLN
INTRAMUSCULAR | Status: AC
  Administered 2019-09-01: 25 ug via INTRAVENOUS
  Filled 2019-09-01: qty 2

## 2019-09-01 MED ORDER — LACTATED RINGERS IV SOLN: INTRAVENOUS | Status: DC

## 2019-09-01 MED ORDER — DEXMEDETOMIDINE HCL 200 MCG/2ML IV SOLN
INTRAVENOUS | Status: DC | PRN
  Administered 2019-09-01: 4 ug via INTRAVENOUS
  Administered 2019-09-01 (×2): 8 ug via INTRAVENOUS

## 2019-09-01 MED ORDER — ACETAMINOPHEN 500 MG PO TABS: 1000.0000 mg | ORAL_TABLET | ORAL | Status: AC

## 2019-09-01 MED ORDER — OXYCODONE HCL 5 MG PO TABS
ORAL_TABLET | ORAL | Status: AC
  Administered 2019-09-01: 5 mg via ORAL
  Filled 2019-09-01: qty 1

## 2019-09-01 MED ORDER — CHLORHEXIDINE GLUCONATE 0.12 % MT SOLN: 15.0000 mL | Freq: Once | OROMUCOSAL | Status: AC

## 2019-09-01 MED ORDER — PHENYLEPHRINE HCL (PRESSORS) 10 MG/ML IV SOLN
INTRAVENOUS | Status: DC | PRN
  Administered 2019-09-01 (×4): 200 ug via INTRAVENOUS
  Administered 2019-09-01: 100 ug via INTRAVENOUS

## 2019-09-01 MED ORDER — DEXAMETHASONE SODIUM PHOSPHATE 10 MG/ML IJ SOLN
INTRAMUSCULAR | Status: AC
  Filled 2019-09-01: qty 1

## 2019-09-01 MED ORDER — DEXAMETHASONE SODIUM PHOSPHATE 10 MG/ML IJ SOLN
INTRAMUSCULAR | Status: DC | PRN
  Administered 2019-09-01: 10 mg via INTRAVENOUS

## 2019-09-01 MED ORDER — OXYCODONE HCL 5 MG PO TABS
5.0000 mg | ORAL_TABLET | Freq: Once | ORAL | Status: AC | PRN
  Administered 2019-09-01: 5 mg via ORAL

## 2019-09-01 MED ORDER — FENTANYL CITRATE (PF) 100 MCG/2ML IJ SOLN
25.0000 ug | INTRAMUSCULAR | Status: DC | PRN
  Administered 2019-09-01: 50 ug via INTRAVENOUS

## 2019-09-01 MED ORDER — ACETAMINOPHEN 325 MG PO TABS: 650.0000 mg | ORAL_TABLET | Freq: Three times a day (TID) | ORAL | 0 refills | Status: AC | PRN

## 2019-09-01 SURGICAL SUPPLY — 53 items
ANCHOR TIS RET SYS 235ML (MISCELLANEOUS) ×4 IMPLANT
BAG INFUSER PRESSURE 100CC (MISCELLANEOUS) IMPLANT
BLADE SURG SZ11 CARB STEEL (BLADE) ×4 IMPLANT
CANISTER SUCT 1200ML W/VALVE (MISCELLANEOUS) ×4 IMPLANT
CANNULA REDUC XI 12-8 STAPL (CANNULA) ×1
CANNULA REDUC XI 12-8MM STAPL (CANNULA) ×1
CANNULA REDUCER 12-8 DVNC XI (CANNULA) ×2 IMPLANT
CHLORAPREP W/TINT 26 (MISCELLANEOUS) ×4 IMPLANT
CLIP VESOLOCK MED LG 6/CT (CLIP) ×4 IMPLANT
COVER TIP SHEARS 8 DVNC (MISCELLANEOUS) ×2 IMPLANT
COVER TIP SHEARS 8MM DA VINCI (MISCELLANEOUS) ×2
COVER WAND RF STERILE (DRAPES) ×4 IMPLANT
DECANTER SPIKE VIAL GLASS SM (MISCELLANEOUS) ×8 IMPLANT
DEFOGGER SCOPE WARMER CLEARIFY (MISCELLANEOUS) ×4 IMPLANT
DERMABOND ADVANCED (GAUZE/BANDAGES/DRESSINGS) ×2
DERMABOND ADVANCED .7 DNX12 (GAUZE/BANDAGES/DRESSINGS) ×2 IMPLANT
DRAPE ARM DVNC X/XI (DISPOSABLE) ×8 IMPLANT
DRAPE COLUMN DVNC XI (DISPOSABLE) ×2 IMPLANT
DRAPE DA VINCI XI ARM (DISPOSABLE) ×8
DRAPE DA VINCI XI COLUMN (DISPOSABLE) ×2
ELECT CAUTERY BLADE 6.4 (BLADE) ×4 IMPLANT
ELECT REM PT RETURN 9FT ADLT (ELECTROSURGICAL) ×4
ELECTRODE REM PT RTRN 9FT ADLT (ELECTROSURGICAL) ×2 IMPLANT
GLOVE BIOGEL PI IND STRL 7.0 (GLOVE) ×4 IMPLANT
GLOVE BIOGEL PI INDICATOR 7.0 (GLOVE) ×4
GLOVE SURG SYN 6.5 ES PF (GLOVE) ×8 IMPLANT
GOWN STRL REUS W/ TWL LRG LVL3 (GOWN DISPOSABLE) ×6 IMPLANT
GOWN STRL REUS W/TWL LRG LVL3 (GOWN DISPOSABLE) ×6
GRASPER SUT TROCAR 14GX15 (MISCELLANEOUS) IMPLANT
IRRIGATOR SUCT 8 DISP DVNC XI (IRRIGATION / IRRIGATOR) IMPLANT
IRRIGATOR SUCTION 8MM XI DISP (IRRIGATION / IRRIGATOR)
IV NS 1000ML (IV SOLUTION)
IV NS 1000ML BAXH (IV SOLUTION) IMPLANT
LABEL OR SOLS (LABEL) ×4 IMPLANT
NEEDLE HYPO 22GX1.5 SAFETY (NEEDLE) ×4 IMPLANT
NEEDLE INSUFFLATION 14GA 120MM (NEEDLE) ×4 IMPLANT
NS IRRIG 500ML POUR BTL (IV SOLUTION) ×4 IMPLANT
OBTURATOR OPTICAL STANDARD 8MM (TROCAR) ×2
OBTURATOR OPTICAL STND 8 DVNC (TROCAR) ×2
OBTURATOR OPTICALSTD 8 DVNC (TROCAR) ×2 IMPLANT
PACK LAP CHOLECYSTECTOMY (MISCELLANEOUS) ×4 IMPLANT
PENCIL ELECTRO HAND CTR (MISCELLANEOUS) ×4 IMPLANT
SEAL CANN UNIV 5-8 DVNC XI (MISCELLANEOUS) ×6 IMPLANT
SEAL XI 5MM-8MM UNIVERSAL (MISCELLANEOUS) ×6
SET TUBE SMOKE EVAC HIGH FLOW (TUBING) ×4 IMPLANT
SOLUTION ELECTROLUBE (MISCELLANEOUS) ×4 IMPLANT
SPONGE LAP 4X18 RFD (DISPOSABLE) ×4 IMPLANT
STAPLER CANNULA SEAL DVNC XI (STAPLE) ×2 IMPLANT
STAPLER CANNULA SEAL XI (STAPLE) ×2
SUT MNCRL 4-0 (SUTURE) ×4
SUT MNCRL 4-0 27XMFL (SUTURE) ×4
SUT VICRYL 0 AB UR-6 (SUTURE) ×4 IMPLANT
SUTURE MNCRL 4-0 27XMF (SUTURE) ×4 IMPLANT

## 2019-09-01 NOTE — Transfer of Care (Signed)
Immediate Anesthesia Transfer of Care Note  Patient: Loretta Dalton  Procedure(s) Performed: XI ROBOTIC ASSISTED LAPAROSCOPIC CHOLECYSTECTOMY (N/A Abdomen) INDOCYANINE GREEN FLUORESCENCE IMAGING (ICG)  Patient Location: PACU  Anesthesia Type:General  Level of Consciousness: awake, alert  and oriented  Airway & Oxygen Therapy: Patient Spontanous Breathing and Patient connected to face mask oxygen  Post-op Assessment: Report given to RN and Post -op Vital signs reviewed and stable  Post vital signs: Reviewed and stable  Last Vitals:  Vitals Value Taken Time  BP    Temp    Pulse 107 09/01/19 1117  Resp 15 09/01/19 1117  SpO2 99 % 09/01/19 1117  Vitals shown include unvalidated device data.  Last Pain:  Vitals:   09/01/19 0830  TempSrc: Oral  PainSc: 0-No pain         Complications: No complications documented.

## 2019-09-01 NOTE — Anesthesia Preprocedure Evaluation (Signed)
Anesthesia Evaluation  Patient identified by MRN, date of birth, ID band Patient awake    Reviewed: Allergy & Precautions, H&P , NPO status , Patient's Chart, lab work & pertinent test results  History of Anesthesia Complications Negative for: history of anesthetic complications  Airway Mallampati: III  TM Distance: >3 FB Neck ROM: full    Dental  (+) Chipped   Pulmonary shortness of breath, former smoker,    Pulmonary exam normal        Cardiovascular Exercise Tolerance: Good (-) angina(-) Past MI and (-) DOE negative cardio ROS Normal cardiovascular exam     Neuro/Psych  Headaches, PSYCHIATRIC DISORDERS    GI/Hepatic negative GI ROS, Neg liver ROS, neg GERD  ,  Endo/Other  negative endocrine ROS  Renal/GU      Musculoskeletal   Abdominal   Peds  Hematology negative hematology ROS (+)   Anesthesia Other Findings Past Medical History: No date: Anxiety No date: Dyspnea     Comment:  when bends over or walks very fast  No date: Headache     Comment:  chronic headaches  Past Surgical History: No date: CESAREAN SECTION No date: TONSILLECTOMY  BMI    Body Mass Index: 34.99 kg/m      Reproductive/Obstetrics negative OB ROS                             Anesthesia Physical Anesthesia Plan  ASA: III  Anesthesia Plan: General ETT   Post-op Pain Management:    Induction: Intravenous  PONV Risk Score and Plan: Ondansetron, Dexamethasone, Midazolam and Treatment may vary due to age or medical condition  Airway Management Planned: Oral ETT  Additional Equipment:   Intra-op Plan:   Post-operative Plan: Extubation in OR  Informed Consent: I have reviewed the patients History and Physical, chart, labs and discussed the procedure including the risks, benefits and alternatives for the proposed anesthesia with the patient or authorized representative who has indicated his/her  understanding and acceptance.     Dental Advisory Given  Plan Discussed with: Anesthesiologist, CRNA and Surgeon  Anesthesia Plan Comments: (Patient consented for risks of anesthesia including but not limited to:  - adverse reactions to medications - damage to eyes, teeth, lips or other oral mucosa - nerve damage due to positioning  - sore throat or hoarseness - Damage to heart, brain, nerves, lungs, other parts of body or loss of life  Patient voiced understanding.)        Anesthesia Quick Evaluation

## 2019-09-01 NOTE — Anesthesia Postprocedure Evaluation (Signed)
Anesthesia Post Note  Patient: Kalyssa Anker Fluty  Procedure(s) Performed: XI ROBOTIC ASSISTED LAPAROSCOPIC CHOLECYSTECTOMY (N/A Abdomen) INDOCYANINE GREEN FLUORESCENCE IMAGING (ICG)  Patient location during evaluation: PACU Anesthesia Type: General Level of consciousness: awake and alert Pain management: pain level controlled Vital Signs Assessment: post-procedure vital signs reviewed and stable Respiratory status: spontaneous breathing, nonlabored ventilation, respiratory function stable and patient connected to nasal cannula oxygen Cardiovascular status: blood pressure returned to baseline and stable Postop Assessment: no apparent nausea or vomiting Anesthetic complications: no   No complications documented.   Last Vitals:  Vitals:   09/01/19 1117 09/01/19 1132  BP: 108/74 (!) 112/55  Pulse: (!) 106   Resp: 19   Temp: 36.4 C   SpO2: 100%     Last Pain:  Vitals:   09/01/19 1244  TempSrc:   PainSc: 6                  Precious Haws Jesicca Dipierro

## 2019-09-01 NOTE — Op Note (Signed)
Preoperative diagnosis:  biliary dyskinesia  Postoperative diagnosis: same as above  Procedure: Robotic assisted Laparoscopic Cholecystectomy.   Anesthesia: GETA   Surgeon: Benjamine Sprague  Specimen: Gallbladder  Complications: None  EBL: 27mL  Wound Classification: Clean Contaminated  Indications: see HPI  Findings: Critical view of safety noted Cystic duct and artery identified, ligated and divided, clips remained intact at end of procedure Adequate hemostasis  Description of procedure:  The patient was placed on the operating table in the supine position. SCDs placed, pre-op abx administered.  General anesthesia was induced and OG tube placed by anesthesia. A time-out was completed verifying correct patient, procedure, site, positioning, and implant(s) and/or special equipment prior to beginning this procedure. The abdomen was prepped and draped in the usual sterile fashion.    Veress needle was placed at the Palmer's point and insufflation was started after confirming a positive saline drop test and no immediate increase in abdominal pressure.  After reaching 15 mm, the Veress needle was removed and a 8 mm port was placed via optiview technique under umbilicus measured 16XW from gallbladder.  The abdomen was inspected and no abnormalities or injuries were found.  Under direct vision, ports were placed in the following locations: One 12 mm patient left of the umbilicus, 8cm from the optiviewed port, one 8 mm port placed to the patient right of the umbilical port 8 cm apart.  1 additional 8 mm port placed lateral to the 35mm port.  Once ports were placed, The table was placed in the reverse Trendelenburg position with the right side up. The Xi platform was brought into the operative field and docked to the ports successfully.  An endoscope was placed through the umbilical port, fenestrated grasper through the adjacent patient right port, prograsp to the far patient left port, and then a  hook cautery in the left port.  The dome of the gallbladder was grasped with prograsp, passed and retracted over the dome of the liver. Adhesions between the gallbladder and omentum, duodenum and transverse colon were lysed via hook cautery. The infundibulum was grasped with the fenestrated grasper and retracted toward the right lower quadrant. This maneuver exposed Calot's triangle. The peritoneum overlying the gallbladder infundibulum was then dissected using combination of [place] dissector and electrocautery hook and the cystic duct and cystic artery identified.  Critical view of safety with the liver bed clearly visible behind the duct and artery with no additional structures noted.  The cystic duct and cystic artery clipped and divided close to the gallbladder.  2 robotic clips were placed at the base of the cystic duct, one clip for the cystic artery.   The gallbladder was then dissected from its peritoneal and liver bed attachments by electrocautery. Hemostasis was checked prior to removing the hook cautery and the Endo Catch bag was then placed through the 12 mm port and the gallbladder was removed.  The gallbladder was passed off the table as a specimen. There was no evidence of bleeding from the gallbladder fossa or cystic artery or leakage of the bile from the cystic duct stump. The 12 mm port site closed with PMI using 0 vicryl under direct vision.  Abdomen desufflated and secondary trocars were removed under direct vision. No bleeding was noted. All skin incisions then closed with subcuticular sutures of 4-0 monocryl and dressed with topical skin adhesive. The orogastric tube was removed and patient extubated.  The patient tolerated the procedure well and was taken to the postanesthesia care unit in stable  condition.  All sponge and instrument count correct at end of procedure.

## 2019-09-01 NOTE — Anesthesia Procedure Notes (Signed)
Procedure Name: Intubation Date/Time: 09/01/2019 9:31 AM Performed by: Caryl Asp, CRNA Pre-anesthesia Checklist: Patient identified, Patient being monitored, Timeout performed, Emergency Drugs available and Suction available Patient Re-evaluated:Patient Re-evaluated prior to induction Oxygen Delivery Method: Circle system utilized Preoxygenation: Pre-oxygenation with 100% oxygen Induction Type: IV induction and Rapid sequence Ventilation: Mask ventilation without difficulty Laryngoscope Size: 3 and McGraph Grade View: Grade III Tube type: Oral Tube size: 7.0 mm Number of attempts: 1 Airway Equipment and Method: Stylet and Video-laryngoscopy Placement Confirmation: ETT inserted through vocal cords under direct vision,  positive ETCO2 and breath sounds checked- equal and bilateral Secured at: 21 cm Tube secured with: Tape Dental Injury: Teeth and Oropharynx as per pre-operative assessment

## 2019-09-01 NOTE — Discharge Instructions (Signed)
General Anesthesia, Adult, Care After This sheet gives you information about how to care for yourself after your procedure. Your health care provider may also give you more specific instructions. If you have problems or questions, contact your health care provider. What can I expect after the procedure? After the procedure, the following side effects are common:  Pain or discomfort at the IV site.  Nausea.  Vomiting.  Sore throat.  Trouble concentrating.  Feeling cold or chills.  Weak or tired.  Sleepiness and fatigue.  Soreness and body aches. These side effects can affect parts of the body that were not involved in surgery. Follow these instructions at home:  For at least 24 hours after the procedure:  Have a responsible adult stay with you. It is important to have someone help care for you until you are awake and alert.  Rest as needed.  Do not: ? Participate in activities in which you could fall or become injured. ? Drive. ? Use heavy machinery. ? Drink alcohol. ? Take sleeping pills or medicines that cause drowsiness. ? Make important decisions or sign legal documents. ? Take care of children on your own. Eating and drinking  Follow any instructions from your health care provider about eating or drinking restrictions.  When you feel hungry, start by eating small amounts of foods that are soft and easy to digest (bland), such as toast. Gradually return to your regular diet.  Drink enough fluid to keep your urine pale yellow.  If you vomit, rehydrate by drinking water, juice, or clear broth. General instructions  If you have sleep apnea, surgery and certain medicines can increase your risk for breathing problems. Follow instructions from your health care provider about wearing your sleep device: ? Anytime you are sleeping, including during daytime naps. ? While taking prescription pain medicines, sleeping medicines, or medicines that make you drowsy.  Return to  your normal activities as told by your health care provider. Ask your health care provider what activities are safe for you.  Take over-the-counter and prescription medicines only as told by your health care provider.  If you smoke, do not smoke without supervision.  Keep all follow-up visits as told by your health care provider. This is important. Contact a health care provider if:  You have nausea or vomiting that does not get better with medicine.  You cannot eat or drink without vomiting.  You have pain that does not get better with medicine.  You are unable to pass urine.  You develop a skin rash.  You have a fever.  You have redness around your IV site that gets worse. Get help right away if:  You have difficulty breathing.  You have chest pain.  You have blood in your urine or stool, or you vomit blood. Summary  After the procedure, it is common to have a sore throat or nausea. It is also common to feel tired.  Have a responsible adult stay with you for the first 24 hours after general anesthesia. It is important to have someone help care for you until you are awake and alert.  When you feel hungry, start by eating small amounts of foods that are soft and easy to digest (bland), such as toast. Gradually return to your regular diet.  Drink enough fluid to keep your urine pale yellow.  Return to your normal activities as told by your health care provider. Ask your health care provider what activities are safe for you. This information is not   intended to replace advice given to you by your health care provider. Make sure you discuss any questions you have with your health care provider. Document Revised: 02/06/2017 Document Reviewed: 09/19/2016 Elsevier Patient Education  [place]. Laparoscopic Cholecystectomy, Care After This sheet gives you information about how to care for yourself after your procedure. Your doctor may also give you more specific  instructions. If you have problems or questions, contact your doctor. Follow these instructions at home: Care for cuts from surgery (incisions)   Follow instructions from your doctor about how to take care of your cuts from surgery. Make sure you: ? Wash your hands with soap and water before you change your bandage (dressing). If you cannot use soap and water, use hand sanitizer. ? Change your bandage as told by your doctor. ? Leave stitches (sutures), skin glue, or skin tape (adhesive) strips in place. They may need to stay in place for 2 weeks or longer. If tape strips get loose and curl up, you may trim the loose edges. Do not remove tape strips completely unless your doctor says it is okay.  Do not take baths, swim, or use a hot tub until your doctor says it is okay. OK TO SHOWER 24HRS AFTER YOUR SURGERY.   Check your surgical cut area every day for signs of infection. Check for: ? More redness, swelling, or pain. ? More fluid or blood. ? Warmth. ? Pus or a bad smell. Activity  Do not drive or use heavy machinery while taking prescription pain medicine.  Do not play contact sports until your doctor says it is okay.  Do not drive for 24 hours if you were given a medicine to help you relax (sedative).  Rest as needed. Do not return to work or school until your doctor says it is okay. General instructions .  tylenol as needed for pain. .  Use narcotics, if prescribed, only when tylenol is not enough to control pain. .  325-650mg  every 8hrs to max of 3000mg /24hrs (including the 325mg  in every norco dose) for the tylenol.    To prevent or treat constipation while you are taking prescription pain medicine, your doctor may recommend that you: ? Drink enough fluid to keep your pee (urine) clear or pale yellow. ? Take over-the-counter or prescription medicines. ? Eat foods that are high in fiber, such as fresh fruits and vegetables, whole grains, and beans. ? Limit foods that are high  in fat and processed sugars, such as fried and sweet foods. Contact a doctor if:  You develop a rash.  You have more redness, swelling, or pain around your surgical cuts.  You have more fluid or blood coming from your surgical cuts.  Your surgical cuts feel warm to the touch.  You have pus or a bad smell coming from your surgical cuts.  You have a fever.  One or more of your surgical cuts breaks open. Get help right away if:  You have trouble breathing.  You have chest pain.  You have pain that is getting worse in your shoulders.  You faint or feel dizzy when you stand.  You have very bad pain in your belly (abdomen).  You are sick to your stomach (nauseous) for more than one day.  You have throwing up (vomiting) that lasts for more than one day.  You have leg pain. This information is not intended to replace advice given to you by your health care provider. Make sure you discuss any questions  you have with your health care provider. Document Released: 11/13/2007 Document Revised: 08/25/2015 Document Reviewed: 07/23/2015 Elsevier Interactive Patient Education  2019 [employer].

## 2019-09-01 NOTE — Interval H&P Note (Signed)
History and Physical Interval Note:  09/01/2019 8:43 AM  Loretta Dalton  has presented today for surgery, with the diagnosis of K82.8 Biliary dyskinesia.  The various methods of treatment have been discussed with the patient and family. After consideration of risks, benefits and other options for treatment, the patient has consented to  Procedure(s): XI ROBOTIC [place]eron (N/A) as a surgical intervention.  The patient's history has been reviewed, patient examined, no change in status, stable for surgery.  I have reviewed the patient's chart and labs.  Questions were answered to the patient's satisfaction.     Tayshaun Kroh Lysle Pearl

## 2019-09-02 LAB — SURGICAL PATHOLOGY

## 2019-09-05 ENCOUNTER — Telehealth: Payer: Self-pay

## 2019-09-05 NOTE — Telephone Encounter (Signed)
Phone call to pt. Advised of water contamination issue in the [place] last week.  Reassured pt. That she is considered low risk to have an infection, as [facility]'s water was tested, and was not contaminated.  The pt. Stated she had stomach cramps yesterday, and diarrhea, but it has subsided.  She questioned if the above symptoms could be related to her just recovering from surgery.  Advised that with exposure to E. Coli, the symptoms can begin 3-4 days after exposure.  The pt. Stated she has not continued to have symptoms that she experienced yesterday.  Encouraged her to report symptoms to her PCP, for further recommendations.  Pt. Verb. Understanding and agreed to contact her PCP.

## 2019-10-11 ENCOUNTER — Other Ambulatory Visit: Payer: Self-pay | Admitting: Gastroenterology

## 2019-10-11 DIAGNOSIS — N2889 Other specified disorders of kidney and ureter: Secondary | ICD-10-CM

## 2019-10-11 DIAGNOSIS — R0602 Shortness of breath: Secondary | ICD-10-CM

## 2019-10-14 ENCOUNTER — Other Ambulatory Visit
Admission: RE | Admit: 2019-10-14 | Discharge: 2019-10-14 | Disposition: A | Discharge status: Home / Self Care | Payer: 59 | Source: Ambulatory Visit | Attending: Gastroenterology | Admitting: Gastroenterology

## 2019-10-14 ENCOUNTER — Other Ambulatory Visit: Payer: Self-pay

## 2019-10-14 DIAGNOSIS — Z20822 Contact with and (suspected) exposure to covid-19: Secondary | ICD-10-CM | POA: Insufficient documentation

## 2019-10-14 DIAGNOSIS — Z01812 Encounter for preprocedural laboratory examination: Secondary | ICD-10-CM | POA: Diagnosis present

## 2019-10-14 LAB — SARS CORONAVIRUS 2 (TAT 6-24 HRS): SARS Coronavirus 2: NEGATIVE

## 2019-10-18 ENCOUNTER — Ambulatory Visit: Payer: 59 | Admitting: Certified Registered"

## 2019-10-18 ENCOUNTER — Other Ambulatory Visit: Payer: Self-pay

## 2019-10-18 ENCOUNTER — Ambulatory Visit
Admission: RE | Admit: 2019-10-18 | Discharge: 2019-10-18 | Disposition: A | Discharge status: Home / Self Care | Payer: 59 | Attending: Gastroenterology | Admitting: Gastroenterology

## 2019-10-18 ENCOUNTER — Encounter
Admission: RE | Disposition: A | Discharge status: Home / Self Care | Payer: Self-pay | Source: Home / Self Care | Attending: Gastroenterology

## 2019-10-18 ENCOUNTER — Encounter: Payer: Self-pay | Admitting: Internal Medicine

## 2019-10-18 DIAGNOSIS — R1031 Right lower quadrant pain: Secondary | ICD-10-CM | POA: Insufficient documentation

## 2019-10-18 DIAGNOSIS — K529 Noninfective gastroenteritis and colitis, unspecified: Secondary | ICD-10-CM | POA: Insufficient documentation

## 2019-10-18 DIAGNOSIS — K3189 Other diseases of stomach and duodenum: Secondary | ICD-10-CM | POA: Diagnosis not present

## 2019-10-18 DIAGNOSIS — D122 Benign neoplasm of ascending colon: Secondary | ICD-10-CM | POA: Diagnosis not present

## 2019-10-18 DIAGNOSIS — K635 Polyp of colon: Secondary | ICD-10-CM | POA: Diagnosis not present

## 2019-10-18 DIAGNOSIS — K317 Polyp of stomach and duodenum: Secondary | ICD-10-CM | POA: Insufficient documentation

## 2019-10-18 DIAGNOSIS — Z79899 Other long term (current) drug therapy: Secondary | ICD-10-CM | POA: Diagnosis not present

## 2019-10-18 DIAGNOSIS — K640 First degree hemorrhoids: Secondary | ICD-10-CM | POA: Insufficient documentation

## 2019-10-18 DIAGNOSIS — F419 Anxiety disorder, unspecified: Secondary | ICD-10-CM | POA: Diagnosis not present

## 2019-10-18 DIAGNOSIS — K449 Diaphragmatic hernia without obstruction or gangrene: Secondary | ICD-10-CM | POA: Insufficient documentation

## 2019-10-18 DIAGNOSIS — D509 Iron deficiency anemia, unspecified: Secondary | ICD-10-CM | POA: Diagnosis not present

## 2019-10-18 HISTORY — PX: COLONOSCOPY WITH PROPOFOL: SHX5780

## 2019-10-18 HISTORY — PX: ESOPHAGOGASTRODUODENOSCOPY (EGD) WITH PROPOFOL: SHX5813

## 2019-10-18 SURGERY — COLONOSCOPY WITH PROPOFOL
Anesthesia: General

## 2019-10-18 MED ORDER — LIDOCAINE HCL (CARDIAC) PF 100 MG/5ML IV SOSY
PREFILLED_SYRINGE | INTRAVENOUS | Status: DC | PRN
  Administered 2019-10-18: 100 mg via INTRAVENOUS

## 2019-10-18 MED ORDER — SODIUM CHLORIDE 0.9 % IV SOLN
INTRAVENOUS | Status: DC
  Administered 2019-10-18: 1000 mL via INTRAVENOUS

## 2019-10-18 MED ORDER — PHENYLEPHRINE HCL (PRESSORS) 10 MG/ML IV SOLN
INTRAVENOUS | Status: DC | PRN
  Administered 2019-10-18: 100 ug via INTRAVENOUS

## 2019-10-18 MED ORDER — MIDAZOLAM HCL 2 MG/2ML IJ SOLN
INTRAMUSCULAR | Status: DC | PRN
Start: 2019-10-18 — End: 2019-10-18
  Administered 2019-10-18 (×2): 1 mg via INTRAVENOUS

## 2019-10-18 MED ORDER — PROPOFOL 10 MG/ML IV BOLUS
INTRAVENOUS | Status: DC | PRN
  Administered 2019-10-18: 155 ug/kg/min via INTRAVENOUS

## 2019-10-18 MED ORDER — PROPOFOL 500 MG/50ML IV EMUL
INTRAVENOUS | Status: DC | PRN
  Administered 2019-10-18: 50 ug/kg/min via INTRAVENOUS

## 2019-10-18 MED ORDER — MIDAZOLAM HCL 2 MG/2ML IJ SOLN
INTRAMUSCULAR | Status: AC
  Filled 2019-10-18: qty 2

## 2019-10-18 MED ORDER — ESMOLOL HCL 100 MG/10ML IV SOLN
INTRAVENOUS | Status: DC | PRN
Start: 2019-10-18 — End: 2019-10-18
  Administered 2019-10-18: 10 mg via INTRAVENOUS

## 2019-10-18 MED ORDER — GLYCOPYRROLATE 0.2 MG/ML IJ SOLN
INTRAMUSCULAR | Status: DC | PRN
  Administered 2019-10-18: .2 mg via INTRAVENOUS

## 2019-10-18 MED ORDER — EPINEPHRINE 1 MG/10ML IJ SOSY
PREFILLED_SYRINGE | INTRAMUSCULAR | Status: AC
  Filled 2019-10-18: qty 10

## 2019-10-18 MED ORDER — LIDOCAINE HCL (PF) 1 % IJ SOLN
INTRAMUSCULAR | Status: AC
  Filled 2019-10-18: qty 2

## 2019-10-18 MED ORDER — ESMOLOL HCL 100 MG/10ML IV SOLN
INTRAVENOUS | Status: AC
  Filled 2019-10-18: qty 10

## 2019-10-18 NOTE — Anesthesia Preprocedure Evaluation (Signed)
Anesthesia Evaluation  Patient identified by MRN, date of birth, ID band Patient awake    Reviewed: Allergy & Precautions, H&P , NPO status , Patient's Chart, lab work & pertinent test results  History of Anesthesia Complications Negative for: history of anesthetic complications  Airway Mallampati: III  TM Distance: >3 FB Neck ROM: full    Dental  (+) Chipped, Dental Advidsory Given   Pulmonary shortness of breath and with exertion, neg sleep apnea, neg COPD, neg recent URI, former smoker,    Pulmonary exam normal        Cardiovascular Exercise Tolerance: Good (-) hypertension+ angina (thought to be secondary to significant anemia, EKG okay) (-) Past MI, (-) Cardiac Stents and (-) DOE Normal cardiovascular exam(-) dysrhythmias (-) Valvular Problems/Murmurs     Neuro/Psych  Headaches, neg Seizures PSYCHIATRIC DISORDERS Anxiety    GI/Hepatic negative GI ROS, Neg liver ROS, neg GERD  ,  Endo/Other  negative endocrine ROS  Renal/GU negative Renal ROS     Musculoskeletal   Abdominal   Peds  Hematology  (+) Blood dyscrasia, anemia ,   Anesthesia Other Findings Past Medical History: No date: Anxiety No date: Dyspnea     Comment:  when bends over or walks very fast  No date: Headache     Comment:  chronic headaches  Past Surgical History: No date: CESAREAN SECTION No date: TONSILLECTOMY  BMI    Body Mass Index: 34.99 kg/m      Reproductive/Obstetrics negative OB ROS                             Anesthesia Physical  Anesthesia Plan  ASA: III  Anesthesia Plan: General   Post-op Pain Management:    Induction: Intravenous  PONV Risk Score and Plan: Treatment may vary due to age or medical condition, Propofol infusion and TIVA  Airway Management Planned: Natural Airway and Nasal Cannula  Additional Equipment:   Intra-op Plan:   Post-operative Plan:   Informed Consent: I have  reviewed the patients History and Physical, chart, labs and discussed the procedure including the risks, benefits and alternatives for the proposed anesthesia with the patient or authorized representative who has indicated his/her understanding and acceptance.     Dental Advisory Given  Plan Discussed with: Anesthesiologist, CRNA and Surgeon  Anesthesia Plan Comments: (Patient consented for risks of anesthesia including but not limited to:  - adverse reactions to medications - damage to eyes, teeth, lips or other oral mucosa - nerve damage due to positioning  - sore throat or hoarseness - Damage to heart, brain, nerves, lungs, other parts of body or loss of life  Patient voiced understanding.)        Anesthesia Quick Evaluation

## 2019-10-18 NOTE — Op Note (Signed)
[facility] Gastroenterology Patient Name: Loretta Dalton Procedure Date: 10/18/2019 12:16 PM MRN: [id number] Account #: [id number] Date of Birth: 1971/02/16 Admit Type: Outpatient Age: 49 Room: [facility] ENDO ROOM 3 Gender: Female Note Status: Finalized Procedure:             Upper GI endoscopy Indications:           Abdominal pain in the right lower quadrant, Iron                         deficiency anemia, Diarrhea Providers:             Andrey Farmer MD, MD Medicines:             Monitored Anesthesia Care Complications:         No immediate complications. Estimated blood loss:                         Minimal. Procedure:             Pre-Anesthesia Assessment:                        - Prior to the procedure, a History and Physical was                         performed, and patient medications and allergies were                         reviewed. The patient is competent. The risks and                         benefits of the procedure and the sedation options and                         risks were discussed with the patient. All questions                         were answered and informed consent was obtained.                         Patient identification and proposed procedure were                         verified by the physician, the nurse, the anesthetist                         and the technician in the endoscopy suite. Mental                         Status Examination: alert and oriented. Airway                         Examination: normal oropharyngeal airway and neck                         mobility. Respiratory Examination: clear to                         auscultation. CV Examination: normal. Prophylactic  Antibiotics: The patient does not require prophylactic                         antibiotics. Prior Anticoagulants: The patient has                         taken no previous anticoagulant or antiplatelet                          agents. ASA Grade Assessment: II - A patient with mild                         systemic disease. After reviewing the risks and                         benefits, the patient was deemed in satisfactory                         condition to undergo the procedure. The anesthesia                         plan was to use monitored anesthesia care (MAC).                         Immediately prior to administration of medications,                         the patient was re-assessed for adequacy to receive                         sedatives. The heart rate, respiratory rate, oxygen                         saturations, blood pressure, adequacy of pulmonary                         ventilation, and response to care were monitored                         throughout the procedure. The physical status of the                         patient was re-assessed after the procedure.                        After obtaining informed consent, the endoscope was                         passed under direct vision. Throughout the procedure,                         the patient's blood pressure, pulse, and oxygen                         saturations were monitored continuously. The Endoscope                         was introduced through the mouth, and advanced to the  second part of duodenum. The upper GI endoscopy was                         accomplished without difficulty. The patient tolerated                         the procedure well. Findings:      A single 8 mm nodule with a localized distribution was found at the       gastroesophageal junction. Biopsies were taken with a cold forceps for       histology. Estimated blood loss was minimal.      A small hiatal hernia was present.      The exam of the esophagus was otherwise normal.      A single 2 mm papule (nodule) with no stigmata of recent bleeding was       found in the cardia. Biopsies were taken with a cold forceps for       histology.  Estimated blood loss was minimal.      The exam of the stomach was otherwise normal.      The examined duodenum was normal. Impression:            - Nodule found in the esophagus. Biopsied.                        - Small hiatal hernia.                        - A single papule (nodule) found in the stomach.                         Biopsied.                        - Normal examined duodenum. Recommendation:        - Discharge patient to home.                        - Resume previous diet.                        - Continue present medications.                        - Await pathology results.                        - Return to referring physician as previously                         scheduled. Procedure Code(s):     --- Professional ---                        [phone], Esophagogastroduodenoscopy, flexible,                         transoral; with biopsy, single or multiple Diagnosis Code(s):     --- Professional ---                        K22.8, Other specified diseases of esophagus  K44.9, Diaphragmatic hernia without obstruction or                         gangrene                        K31.89, Other diseases of stomach and duodenum                        R10.31, Right lower quadrant pain                        D50.9, Iron deficiency anemia, unspecified                        R19.7, Diarrhea, unspecified CPT copyright 2019 American Medical Association. All rights reserved. The codes documented in this report are preliminary and upon coder review may  be revised to meet current compliance requirements. Andrey Farmer, MD Andrey Farmer MD, MD 10/18/2019 1:06:33 PM Number of Addenda: 0 Note Initiated On: 10/18/2019 12:16 PM Estimated Blood Loss:  Estimated blood loss was minimal.      [facility]

## 2019-10-18 NOTE — Op Note (Signed)
[facility] Gastroenterology Patient Name: Loretta Dalton Procedure Date: 10/18/2019 12:15 PM MRN: [id number] Account #: [id number] Date of Birth: 1970/04/30 Admit Type: Outpatient Age: 49 Room: [facility] ENDO ROOM 3 Gender: Female Note Status: Finalized Procedure:             Colonoscopy Indications:           Iron deficiency anemia, Change in bowel habits,                         Chronic diarrhea Providers:             Andrey Farmer MD, MD Medicines:             Monitored Anesthesia Care Complications:         No immediate complications. Estimated blood loss:                         Minimal. Procedure:             Pre-Anesthesia Assessment:                        - Prior to the procedure, a History and Physical was                         performed, and patient medications and allergies were                         reviewed. The patient is competent. The risks and                         benefits of the procedure and the sedation options and                         risks were discussed with the patient. All questions                         were answered and informed consent was obtained.                         Patient identification and proposed procedure were                         verified by the physician, the nurse, the anesthetist                         and the technician in the endoscopy suite. Mental                         Status Examination: alert and oriented. Airway                         Examination: normal oropharyngeal airway and neck                         mobility. Respiratory Examination: clear to                         auscultation. CV Examination: normal. Prophylactic  Antibiotics: The patient does not require prophylactic                         antibiotics. Prior Anticoagulants: The patient has                         taken no previous anticoagulant or antiplatelet                         agents. ASA Grade  Assessment: II - A patient with mild                         systemic disease. After reviewing the risks and                         benefits, the patient was deemed in satisfactory                         condition to undergo the procedure. The anesthesia                         plan was to use monitored anesthesia care (MAC).                         Immediately prior to administration of medications,                         the patient was re-assessed for adequacy to receive                         sedatives. The heart rate, respiratory rate, oxygen                         saturations, blood pressure, adequacy of pulmonary                         ventilation, and response to care were monitored                         throughout the procedure. The physical status of the                         patient was re-assessed after the procedure.                        After obtaining informed consent, the colonoscope was                         passed under direct vision. Throughout the procedure,                         the patient's blood pressure, pulse, and oxygen                         saturations were monitored continuously. The                         Colonoscope was introduced through the anus and  advanced to the the terminal ileum. The colonoscopy                         was performed without difficulty. The patient                         tolerated the procedure well. The quality of the bowel                         preparation was good. Findings:      The perianal and digital rectal examinations were normal.      The terminal ileum appeared normal.      A 3 mm polyp was found in the ascending colon. The polyp was sessile.       The polyp was removed with a cold snare. Resection and retrieval were       complete. Estimated blood loss was minimal.      A 3 mm polyp was found in the sigmoid colon. The polyp was sessile. The       polyp was removed with a cold  snare. Resection and retrieval were       complete. Estimated blood loss was minimal.      Non-bleeding internal hemorrhoids were found during retroflexion. The       hemorrhoids were Grade I (internal hemorrhoids that do not prolapse).      The exam was otherwise without abnormality on direct and retroflexion       views. Impression:            - The examined portion of the ileum was normal.                        - One 3 mm polyp in the ascending colon, removed with                         a cold snare. Resected and retrieved.                        - One 3 mm polyp in the sigmoid colon, removed with a                         cold snare. Resected and retrieved.                        - Non-bleeding internal hemorrhoids.                        - The examination was otherwise normal on direct and                         retroflexion views. Recommendation:        - Discharge patient to home.                        - Resume previous diet.                        - Continue present medications.                        - Await pathology results.                        -  Repeat colonoscopy in 7 years for surveillance.                        - Return to referring physician as previously                         scheduled. Procedure Code(s):     --- Professional ---                        [phone], Colonoscopy, flexible; with removal of                         tumor(s), polyp(s), or other lesion(s) by snare                         technique Diagnosis Code(s):     --- Professional ---                        K64.0, First degree hemorrhoids                        K63.5, Polyp of colon                        D50.9, Iron deficiency anemia, unspecified                        R19.4, Change in bowel habit                        K52.9, Noninfective gastroenteritis and colitis,                         unspecified CPT copyright 2019 American Medical Association. All rights reserved. The codes documented in  this report are preliminary and upon coder review may  be revised to meet current compliance requirements. Andrey Farmer, MD Andrey Farmer MD, MD 10/18/2019 1:09:55 PM Number of Addenda: 0 Note Initiated On: 10/18/2019 12:15 PM Scope Withdrawal Time: 0 hours 9 minutes 56 seconds  Total Procedure Duration: 0 hours 16 minutes 14 seconds  Estimated Blood Loss:  Estimated blood loss was minimal.      [facility]

## 2019-10-18 NOTE — Anesthesia Postprocedure Evaluation (Signed)
Anesthesia Post Note  Patient: Shama Monfils Irizarry  Procedure(s) Performed: COLONOSCOPY WITH PROPOFOL (N/A ) ESOPHAGOGASTRODUODENOSCOPY (EGD) WITH PROPOFOL (N/A )  Patient location during evaluation: Endoscopy Anesthesia Type: General Level of consciousness: awake and alert Pain management: pain level controlled Vital Signs Assessment: post-procedure vital signs reviewed and stable Respiratory status: spontaneous breathing, nonlabored ventilation, respiratory function stable and patient connected to nasal cannula oxygen Cardiovascular status: blood pressure returned to baseline and stable Postop Assessment: no apparent nausea or vomiting Anesthetic complications: no   No complications documented.   Last Vitals:  Vitals:   10/18/19 1315 10/18/19 1325  BP: 110/78 113/80  Pulse: (!) 101 91  Resp: 16 (!) 24  Temp:    SpO2: 97% 100%    Last Pain:  Vitals:   10/18/19 1325  TempSrc:   PainSc: 0-No pain                 Martha Clan

## 2019-10-18 NOTE — H&P (Signed)
Outpatient short stay form Pre-procedure 10/18/2019 12:13 PM Loretta Miyamoto MD, MPH  Primary Physician: Dr. Carrie Mew  Reason for visit:  IDA  History of present illness:   49 y/o lady with new onset IDA here for EGD/Colonoscopy. Never had colonoscopy before. Had a couple of EGD's that have been unrevealing. Hx of cholecystectomy and c - section. No blood thinners. Denied family history of GI malignancies.    Current Facility-Administered Medications:  .  lidocaine (PF) (XYLOCAINE) 1 % injection, , , ,   Medications Prior to Admission  Medication Sig Dispense Refill Last Dose  . ALPRAZolam (XANAX) 0.25 MG tablet Take 0.25 mg by mouth 2 (two) times daily as needed for anxiety.      . calcium carbonate (TUMS - DOSED IN MG ELEMENTAL CALCIUM) 500 MG chewable tablet Chew 2-3 tablets by mouth 3 (three) times daily as needed for indigestion or heartburn.     . clobetasol cream (TEMOVATE) 8.46 % Apply 1 application topically 2 (two) times daily.     Marland Kitchen etodolac (LODINE) 400 MG tablet Take 400 mg by mouth daily as needed for moderate pain (headache).      . Homeopathic Products (PSORIASIS/ECZEMA RELIEF EX) Apply 1 application topically 2 (two) times daily as needed (psoriasis).      Marland Kitchen HYDROcodone-acetaminophen (NORCO) 5-325 MG tablet Take 1 tablet by mouth every 6 (six) hours as needed for up to 6 doses for moderate pain. 6 tablet 0   . hydrocortisone cream 1 % Apply 1 application topically daily as needed for itching.      . milk thistle 175 MG tablet Take 175 mg by mouth daily.      . Misc Natural Products (DANDELION ROOT PO) Take 1 tablet by mouth daily.      . Multiple Vitamin (MULTIVITAMIN WITH MINERALS) TABS tablet Take 1 tablet by mouth daily.      . pantoprazole (PROTONIX) 40 MG tablet Take 40 mg by mouth daily.      . polyethylene glycol (MIRALAX / GLYCOLAX) 17 g packet Take 17 g by mouth daily.      . Saline (SIMPLY SALINE) 0.9 % AERS Place 2 sprays into both nostrils 2 (two) times  daily as needed (congestion).         Allergies  Allergen Reactions  . Celebrex [Celecoxib] Other (See Comments)    Shortness of Breath and chest pain  . Augmentin [Amoxicillin-Pot Clavulanate] Nausea Only  . Neosporin Scar Solution [Silicone] Rash     Past Medical History:  Diagnosis Date  . Anxiety   . Dyspnea    when bends over or walks very fast   . Headache    chronic headaches    Review of systems:  Otherwise negative.    Physical Exam  Gen: Alert, oriented. Appears stated age.  HEENT: [place]/AT. PERRLA. Lungs: No respiratory distress Abd: soft, benign, no masses. Ext: No edema.    Planned procedures: Proceed with EGD/colonoscopy. The patient understands the nature of the planned procedure, indications, risks, alternatives and potential complications including but not limited to bleeding, infection, perforation, damage to internal organs and possible oversedation/side effects from anesthesia. The patient agrees and gives consent to proceed.  Please refer to procedure notes for findings, recommendations and patient disposition/instructions.     Loretta Miyamoto MD, MPH Gastroenterology 10/18/2019  12:13 PM

## 2019-10-18 NOTE — Interval H&P Note (Signed)
History and Physical Interval Note:  10/18/2019 12:16 PM  Loretta Dalton  has presented today for surgery, with the diagnosis of DYSPEPSIA SCREENING UPPER ABD PAIN.  The various methods of treatment have been discussed with the patient and family. After consideration of risks, benefits and other options for treatment, the patient has consented to  Procedure(s): COLONOSCOPY WITH PROPOFOL (N/A) ESOPHAGOGASTRODUODENOSCOPY (EGD) WITH PROPOFOL (N/A) as a surgical intervention.  The patient's history has been reviewed, patient examined, no change in status, stable for surgery.  I have reviewed the patient's chart and labs.  Questions were answered to the patient's satisfaction.     Lesly Rubenstein  Ok to proceed with EGD/Colonoscopy.

## 2019-10-18 NOTE — Transfer of Care (Signed)
Immediate Anesthesia Transfer of Care Note  Patient: Loretta Dalton  Procedure(s) Performed: COLONOSCOPY WITH PROPOFOL (N/A ) ESOPHAGOGASTRODUODENOSCOPY (EGD) WITH PROPOFOL (N/A )  Patient Location: Endoscopy Unit  Anesthesia Type:General  Level of Consciousness: drowsy and responds to stimulation  Airway & Oxygen Therapy: Patient Spontanous Breathing and Patient connected to face mask oxygen  Post-op Assessment: Report given to RN and Post -op Vital signs reviewed and stable  Post vital signs: Reviewed and stable  Last Vitals:  Vitals Value Taken Time  BP 111/75 10/18/19 1304  Temp    Pulse 98 10/18/19 1304  Resp 20 10/18/19 1304  SpO2 100 % 10/18/19 1304  Vitals shown include unvalidated device data.  Last Pain:  Vitals:   10/18/19 1157  TempSrc: Tympanic  PainSc: 0-No pain         Complications: No complications documented.

## 2019-10-18 NOTE — Anesthesia Procedure Notes (Signed)
Procedure Name: General with mask airway Performed by: Fletcher-Harrison, Meah Jiron, CRNA Pre-anesthesia Checklist: Patient identified, Emergency Drugs available, Suction available and Patient being monitored Patient Re-evaluated:Patient Re-evaluated prior to induction Oxygen Delivery Method: Simple face mask Induction Type: IV induction Placement Confirmation: positive ETCO2 and CO2 detector Dental Injury: Teeth and Oropharynx as per pre-operative assessment        

## 2019-10-19 ENCOUNTER — Encounter: Payer: Self-pay | Admitting: Gastroenterology

## 2019-10-19 LAB — SURGICAL PATHOLOGY

## 2019-10-31 ENCOUNTER — Ambulatory Visit
Admission: RE | Admit: 2019-10-31 | Discharge: 2019-10-31 | Disposition: A | Discharge status: Home / Self Care | Payer: 59 | Source: Ambulatory Visit | Attending: Gastroenterology | Admitting: Gastroenterology

## 2019-10-31 ENCOUNTER — Other Ambulatory Visit: Payer: Self-pay | Admitting: Gastroenterology

## 2019-10-31 ENCOUNTER — Other Ambulatory Visit: Payer: Self-pay

## 2019-10-31 DIAGNOSIS — R0602 Shortness of breath: Secondary | ICD-10-CM | POA: Diagnosis present

## 2019-10-31 DIAGNOSIS — N2889 Other specified disorders of kidney and ureter: Secondary | ICD-10-CM

## 2019-10-31 MED ORDER — GADOBUTROL 1 MMOL/ML IV SOLN
8.0000 mL | Freq: Once | INTRAVENOUS | Status: AC | PRN
  Administered 2019-10-31: 8 mL via INTRAVENOUS

## 2020-03-14 ENCOUNTER — Other Ambulatory Visit: Payer: Self-pay | Admitting: Obstetrics and Gynecology

## 2020-03-14 DIAGNOSIS — Z1231 Encounter for screening mammogram for malignant neoplasm of breast: Secondary | ICD-10-CM

## 2020-03-26 ENCOUNTER — Other Ambulatory Visit: Payer: Self-pay

## 2020-03-26 ENCOUNTER — Ambulatory Visit: Payer: 59 | Attending: Obstetrics and Gynecology | Admitting: Physical Therapy

## 2020-03-26 DIAGNOSIS — M6281 Muscle weakness (generalized): Secondary | ICD-10-CM | POA: Diagnosis present

## 2020-03-26 DIAGNOSIS — R102 Pelvic and perineal pain: Secondary | ICD-10-CM | POA: Insufficient documentation

## 2020-03-26 DIAGNOSIS — R278 Other lack of coordination: Secondary | ICD-10-CM | POA: Diagnosis present

## 2020-03-26 NOTE — Therapy (Signed)
[facility] [address] [place], [place], [address] Phone: [phone]   Fax:  [phone]  Physical Therapy Evaluation  Patient Details  Name: Loretta Dalton MRN: [id number] Date of Birth: Mar 03, 1970 Referring Provider (PT): Benjaman Kindler   Encounter Date: 03/26/2020   PT End of Session - 03/26/20 0759    Visit Number 1    Number of Visits 8    Date for PT Re-Evaluation 05/21/20    PT Start Time 0800    PT Stop Time 0850    PT Time Calculation (min) 50 min    Activity Tolerance Patient tolerated treatment well    Behavior During Therapy [facility] for tasks assessed/performed           Past Medical History:  Diagnosis Date  . Anxiety   . Dyspnea    when bends over or walks very fast   . Headache    chronic headaches    Past Surgical History:  Procedure Laterality Date  . CESAREAN SECTION    . CHOLECYSTECTOMY    . COLONOSCOPY WITH PROPOFOL N/A 10/18/2019   Procedure: COLONOSCOPY WITH PROPOFOL;  Surgeon: Lesly Rubenstein, MD;  Location: [facility];  Service: Gastroenterology;  Laterality: N/A;  . ESOPHAGOGASTRODUODENOSCOPY (EGD) WITH PROPOFOL N/A 10/18/2019   Procedure: ESOPHAGOGASTRODUODENOSCOPY (EGD) WITH PROPOFOL;  Surgeon: Lesly Rubenstein, MD;  Location: [facility] ENDOSCOPY;  Service: Gastroenterology;  Laterality: N/A;  . TONSILLECTOMY      There were no vitals filed for this visit.     [facility] PT Assessment - 03/26/20 0757      Assessment   Medical Diagnosis Pelvic Pain, SUI    Referring Provider (PT) Benjaman Kindler    Hand Dominance Right    Next MD Visit 03/29/20 [place]    Prior Therapy None      Balance Screen   Has the patient fallen in the past 6 months Yes    Has the patient had a decrease in activity level because of a fear of falling?  No    Is the patient reluctant to leave their home because of a fear of falling?  No           PELVIC HEALTH PHYSICAL THERAPY EVALUATION  SCREENING Red  Flags: None Have you had any night sweats? Unexplained weight loss? Saddle anesthesia? Unexplained changes in bowel or bladder habits?  Precautions: None  SUBJECTIVE  Chief Complaint: "About 90% of the time when we have sex, it hurts." Patient notes that this is not a new problem. Patient states finances have been a barrier to participation in PFPT in the past. She notes that this pain has occurred for years. She notes that she is attracted to her husband and does enjoy sex when it is not painful. Patient notes that she is "very small down there". Patient works Therapist, art for [employer] and despite occasional stress notes she greatly enjoys her work. Patient does have minor UI with stressors and position changes after voiding.  Patient discloses that when she was much younger she had 2 pregnancies that were terminated. Patient became quite tearful and anxious during this disclosure and shared that she has "regretted" the decisions every day of her life. Patient in some emotional distress over this portion of her medical history, pending [place], and current marital counseling.   Pertinent History:  Falls Positive for on icy.  Scoliosis Negative. Pulmonary disease/dysfunction Positive for dyspnea. Surgical history: Positive for see above.   Recent  Procedures/Tests/Findings: [place] scheduled for 03/29/2020  Obstetrical History: G3P1 Deliveries: c-section, emergency Birthing position: back  Gynecological History: Hysterectomy: No  Endometriosis: Negative Last Menstrual Period:  Pain with exam: Yes (notes use of small speculum)  Urinary History: Incontinence: Positive. Onset: October (Covid diagnosis) Triggers: coughing, standing after voiding. Amount: Min. Fluid Intake: +3x 16 oz H20, 2 cups coffee caffeinated, occasional juices/sodas Nocturia: 0-1x/night Frequency of urination: every 1 hours Pain with urination: Negative Difficulty initiating urination: Negative Intermittent  stream: Positive Frequent UTI: Positive.   Gastrointestinal History: [place] Stool Chart: Type 3-4 Frequency of BMs: 1x+/day Pain with defecation: Negative Straining with defecation: Positive for very seldom Incontinence: Negative.  Sexual activity/pain: Pain with intercourse: Positive.   Initial penetration: Yes  Deep thrustingYes   External Stimulation No Self-stimulation No  Able to achieve orgasm Yes  Location of pain: vaginal (superior) Current pain:  0/10  Max pain:  8/10 (stops immediately with end of activity) Least pain:  0/10 Pain quality: pain quality: burning and "hard"  Radiating pain: No   Current activities:  Adult coloring, reading, crosswords, family time   OBJECTIVE  Mental Status Patient is oriented to person, place and time.  Recent memory is intact.  Remote memory is intact.  Attention span and concentration are intact.  Expressive speech is intact.  Patient's fund of knowledge is within normal limits for educational level.  POSTURE/OBSERVATIONS:  Patient prop sits with increased hip adduction, B. Patient sits on front edge of chair.  GAIT: Limited pelvic rotation throughout gait cycle; grossly WFL Trendelenburg R: Negative L: Positive  RANGE OF MOTION: deferred 2/2 to time constraints   LEFT RIGHT  Lumbar forward flexion (65):      Lumbar extension (30):     Lumbar lateral flexion (25):     Thoracic and Lumbar rotation (30 degrees):       Hip Flexion (0-125):      Hip IR (0-45):     Hip ER (0-45):     Hip Abduction (0-40):     Hip extension (0-15):        STRENGTH: MMT deferred 2/2 to time constraints  RLE LLE  Hip Flexion    Hip Extension    Hip Abduction     Hip Adduction     Hip ER     Hip IR     Knee Extension    Knee Flexion    Dorsiflexion     Plantarflexion (seated)     ABDOMINAL: deferred 2/2 to time constraints Palpation: Diastasis: Scar mobility: Rib flare:  SPECIAL TESTS: deferred 2/2 to time  constraints  PHYSICAL PERFORMANCE MEASURES: STS: WNL   EXTERNAL PELVIC EXAM: deferred 2/2 to time constraints Palpation: Breath coordination: Cued Lengthen: Cued Contraction: Cough:  INTERNAL VAGINAL EXAM: deferred 2/2 to time constraints Introitus Appears:  Skin integrity:  Scar mobility: Strength (PERF):  Symmetry: Palpation: Prolapse:     OUTCOME MEASURES: FOTO Urinary Problem 60   ASSESSMENT Patient is a 50 year old presenting to clinic with chief complaints of UI and dyspareunia. Upon examination, patient demonstrates deficits in IAP management, PFM coordination, PFM extensibility, pelvic pain as evidenced by 8/10 pain with penetration, UI with coughing/sneezing and standing after voiding as well as intermittent stream. Patient's responses on FOTO Urinary outcome measures (54) indicate moderate functional limitations/disability/distress. Patient's progress may be limited due to chronicity of pain complaint and emotional factors contributing to symptoms; however, patient's motivation and participation during evaluation is advantageous. Patient was able to achieve basic understanding of  PFM functions and fear avoidance model of pelvic pain during today's evaluation and responded positively to educational interventions. Patient will benefit from continued skilled therapeutic intervention to address deficits in IAP management, PFM coordination, PFM extensibility, pelvic pain in order to increase function and improve overall QOL.  EDUCATION Patient educated on prognosis, POC. Patient articulated understanding and returned demonstration. Patient will benefit from further education in order to maximize compliance and understanding for long-term therapeutic gains.  TREATMENT Neuromuscular Re-education: Patient educated on primary functions of the pelvic floor including: posture/balance, sexual pleasure, storage and elimination of waste from the body, abdominal cavity closure, and  breath coordination. Patient educated on fear avoidance model of pelvic pain for improved understanding of factors contributing to her pain experience and factors that PFPT can assist in restoring.    Objective measurements completed on examination: See above findings.        PT Long Term Goals - 03/26/20 1314      PT LONG TERM GOAL #1   Title Patient will demonstrate independence with HEP in order to maximize therapeutic gains and improve carryover from physical therapy sessions to ADLs in the home and community.    Baseline IE: not demonstrated    Time 8    Period Weeks    Status New    Target Date 05/21/20      PT LONG TERM GOAL #2   Title Patient will demonstrate independent and coordinated diaphragmatic breathing in supine with a 1:2 breathing pattern for improved down-regulation of the nervous system and improved management of intra-abdominal pressures in order to increase function at home and in the community.    Baseline IE: not demonstrated    Time 8    Period Weeks    Status New    Target Date 05/21/20      PT LONG TERM GOAL #3   Title Patient will demonstrate improved function as evidenced by a score of 63 on FOTO measure for full participation in activities at home and in the community.    Baseline IE: 54    Time 8    Period Weeks    Status New    Target Date 05/21/20      PT LONG TERM GOAL #4   Title Patient will decrease worst pain as reported on NPRS by at least 2 points to demonstrate clinically significant reduction in pain in order to restore/improve function and overall QOL.    Baseline IE: 8/10 (penetration)    Time 8    Period Weeks    Status New    Target Date 05/21/20      PT LONG TERM GOAL #5   Title Patient will demonstrate understanding of basic self-management/down-regulation of the nervous system for persistent pain condition and stress as evidenced by diaphragmatic breathing without cueing, body scan/progressive relaxation meditation, and  improved sleep hygiene in order to transition to independent management of patient's chief complaint: dyspareunia.    Baseline IE: not demonstrated    Time 8    Period Weeks    Status New    Target Date 05/21/20                  Plan - 03/26/20 0759    Clinical Impression Statement Patient is a 50 year old presenting to clinic with chief complaints of UI and dyspareunia. Upon examination, patient demonstrates deficits in IAP management, PFM coordination, PFM extensibility, pelvic pain as evidenced by 8/10 pain with penetration, UI with coughing/sneezing and standing after  voiding as well as intermittent stream. Patient's responses on FOTO Urinary outcome measures (54) indicate moderate functional limitations/disability/distress. Patient's progress may be limited due to chronicity of pain complaint and emotional factors contributing to symptoms; however, patient's motivation and participation during evaluation is advantageous. Patient was able to achieve basic understanding of PFM functions and fear avoidance model of pelvic pain during today's evaluation and responded positively to educational interventions. Patient will benefit from continued skilled therapeutic intervention to address deficits in IAP management, PFM coordination, PFM extensibility, pelvic pain in order to increase function and improve overall QOL.    Personal Factors and Comorbidities Age;Fitness;Comorbidity 3+;Past/Current Experience;Time since onset of injury/illness/exacerbation    Comorbidities anxiety, dyspnea, headaches, GERD    Examination-Activity Limitations Continence;Toileting;Sleep    Examination-Participation Restrictions Other;Interpersonal Relationship;Laundry;Community Activity    Stability/Clinical Decision Making Evolving/Moderate complexity    Clinical Decision Making Moderate    Rehab Potential Good    PT Frequency 1x / week    PT Duration 8 weeks    PT Treatment/Interventions Joint  Manipulations;Spinal Manipulations;Patient/family education;Therapeutic activities;Therapeutic exercise;Neuromuscular re-education;Manual techniques;Scar mobilization;Electrical Stimulation;Biofeedback;Aquatic Therapy;Moist Heat;Cryotherapy;ADLs/Self Care Home Management;Orthotic Fit/Training;Passive range of motion;Taping    PT Next Visit Plan physical assessment    PT Home Exercise Plan not initiated    Consulted and Agree with Plan of Care Patient           Patient will benefit from skilled therapeutic intervention in order to improve the following deficits and impairments:  Pain,Improper body mechanics,Postural dysfunction,Decreased strength,Decreased activity tolerance,Decreased coordination,Impaired flexibility,Increased muscle spasms,Increased fascial restricitons,Decreased endurance  Visit Diagnosis: Pelvic pain  Other lack of coordination  Muscle weakness (generalized)     Problem List There are no problems to display for this patient.  Myles Gip PT, DPT [phone]  03/26/2020, 1:18 PM  [facility] [address] [place], [place], [address] Phone: [phone]   Fax:  [phone]  Name: Loretta Dalton MRN: [id number] Date of Birth: August 12, 1970

## 2020-03-30 ENCOUNTER — Other Ambulatory Visit: Payer: Self-pay

## 2020-03-30 ENCOUNTER — Ambulatory Visit
Admission: RE | Admit: 2020-03-30 | Discharge: 2020-03-30 | Disposition: A | Discharge status: Home / Self Care | Payer: 59 | Source: Ambulatory Visit | Attending: Obstetrics and Gynecology | Admitting: Obstetrics and Gynecology

## 2020-03-30 DIAGNOSIS — Z1231 Encounter for screening mammogram for malignant neoplasm of breast: Secondary | ICD-10-CM | POA: Diagnosis not present

## 2020-04-02 ENCOUNTER — Ambulatory Visit: Payer: 59 | Admitting: Physical Therapy

## 2020-04-02 ENCOUNTER — Other Ambulatory Visit: Payer: Self-pay

## 2020-04-02 ENCOUNTER — Encounter: Payer: Self-pay | Admitting: Physical Therapy

## 2020-04-02 DIAGNOSIS — R102 Pelvic and perineal pain: Secondary | ICD-10-CM | POA: Diagnosis not present

## 2020-04-02 DIAGNOSIS — R278 Other lack of coordination: Secondary | ICD-10-CM

## 2020-04-02 DIAGNOSIS — M6281 Muscle weakness (generalized): Secondary | ICD-10-CM

## 2020-04-02 NOTE — Therapy (Signed)
[facility] [address] [place], [place], [address] Phone: [phone]   Fax:  [phone]  Physical Therapy Treatment  Patient Details  Name: Loretta Dalton MRN: [id number] Date of Birth: 15-May-1970 Referring Provider (PT): Benjaman Kindler   Encounter Date: 04/02/2020   PT End of Session - 04/02/20 0803    Visit Number 2    Number of Visits 8    Date for PT Re-Evaluation 05/21/20    PT Start Time 0800    PT Stop Time 1856    PT Time Calculation (min) 55 min    Activity Tolerance Patient tolerated treatment well    Behavior During Therapy [facility] for tasks assessed/performed;Anxious           Past Medical History:  Diagnosis Date  . Anxiety   . Dyspnea    when bends over or walks very fast   . Headache    chronic headaches    Past Surgical History:  Procedure Laterality Date  . CESAREAN SECTION    . CHOLECYSTECTOMY    . COLONOSCOPY WITH PROPOFOL N/A 10/18/2019   Procedure: COLONOSCOPY WITH PROPOFOL;  Surgeon: Lesly Rubenstein, MD;  Location: [facility] ENDOSCOPY;  Service: Gastroenterology;  Laterality: N/A;  . ESOPHAGOGASTRODUODENOSCOPY (EGD) WITH PROPOFOL N/A 10/18/2019   Procedure: ESOPHAGOGASTRODUODENOSCOPY (EGD) WITH PROPOFOL;  Surgeon: Lesly Rubenstein, MD;  Location: [facility] ENDOSCOPY;  Service: Gastroenterology;  Laterality: N/A;  . TONSILLECTOMY      There were no vitals filed for this visit.   Subjective Assessment - 04/02/20 0801    Subjective Patient notes that she followed up with urgent care regarding back pain and SOB. Patient states that SOB is attributable to wearing a mask. Patient states that she had vaginal [place] and was cleared of any cysts and GYN notes that pain is likely related to MSK system. Patient has been referred to cardiology regarding SOB.    Currently in Pain? Yes    Pain Score 2     Pain Location Abdomen    Pain Orientation Lower           TREATMENT  Pre-treatment  assessment: RANGE OF MOTION:    LEFT RIGHT  Lumbar forward flexion (65):  WFL    Lumbar extension (30): WNL*    Lumbar lateral flexion (25):  WNL WNL  Thoracic and Lumbar rotation (30 degrees):    WNL WNL  Hip Flexion (0-125):   WNL WNL  Hip IR (0-45):     Hip ER (0-45):     Hip Abduction (0-40):     Hip extension (0-15):     Patient notes sensation of urinary urgency with lumbar extension.  Patient had increased back pain with repeated extension; modest improvement with repeated flexion.  STRENGTH: MMT   RLE LLE  Hip Flexion 5 5  Hip Extension 5 5  Hip Abduction  5 5  Hip Adduction  5 5  Hip ER  5 5  Hip IR  5 5  Knee Extension 5 5  Knee Flexion 5 5  Dorsiflexion  5 5  Plantarflexion (seated) 5 5   ABDOMINAL:  Palpation: TTP at midline UQ Diastasis:none noted Scar mobility: no restrictions noted  Manual Therapy: Bowel massage for decreased abdominal distension/discomfort and improved gut motility. Skill demonstrated and handout provided for improved retention. MHP applied throughout manual intervention.  Neuromuscular Re-education: Supine hooklying diaphragmatic breathing with VCs and TCs for downregulation of the nervous system and improved management of IAP  Patient educated throughout session on appropriate technique and form using multi-modal cueing, HEP, and activity modification. Patient articulated understanding and returned demonstration.  Patient Response to interventions: Denies increased pain, noted back pain and lower abdominal pain improved at end of session.  ASSESSMENT Patient presents to clinic with excellent motivation to participate in therapy. Patient demonstrates deficits in IAP management, PFM coordination, PFM extensibility, pelvic pain. Patient able to achieve diaphragmatic breath would good consistency during today's session and responded positively to educational and active interventions. Patient will benefit from continued skilled therapeutic  intervention to address remaining deficits in IAP management, PFM coordination, PFM extensibility, pelvic pain in order to increase function and improve overall QOL.     PT Long Term Goals - 03/26/20 1314      PT LONG TERM GOAL #1   Title Patient will demonstrate independence with HEP in order to maximize therapeutic gains and improve carryover from physical therapy sessions to ADLs in the home and community.    Baseline IE: not demonstrated    Time 8    Period Weeks    Status New    Target Date 05/21/20      PT LONG TERM GOAL #2   Title Patient will demonstrate independent and coordinated diaphragmatic breathing in supine with a 1:2 breathing pattern for improved down-regulation of the nervous system and improved management of intra-abdominal pressures in order to increase function at home and in the community.    Baseline IE: not demonstrated    Time 8    Period Weeks    Status New    Target Date 05/21/20      PT LONG TERM GOAL #3   Title Patient will demonstrate improved function as evidenced by a score of 63 on FOTO measure for full participation in activities at home and in the community.    Baseline IE: 54    Time 8    Period Weeks    Status New    Target Date 05/21/20      PT LONG TERM GOAL #4   Title Patient will decrease worst pain as reported on NPRS by at least 2 points to demonstrate clinically significant reduction in pain in order to restore/improve function and overall QOL.    Baseline IE: 8/10 (penetration)    Time 8    Period Weeks    Status New    Target Date 05/21/20      PT LONG TERM GOAL #5   Title Patient will demonstrate understanding of basic self-management/down-regulation of the nervous system for persistent pain condition and stress as evidenced by diaphragmatic breathing without cueing, body scan/progressive relaxation meditation, and improved sleep hygiene in order to transition to independent management of patient's chief complaint: dyspareunia.     Baseline IE: not demonstrated    Time 8    Period Weeks    Status New    Target Date 05/21/20                 Plan - 04/02/20 0804    Clinical Impression Statement Patient presents to clinic with excellent motivation to participate in therapy. Patient demonstrates deficits in IAP management, PFM coordination, PFM extensibility, pelvic pain. Patient able to achieve diaphragmatic breath would good consistency during today's session and responded positively to educational and active interventions. Patient will benefit from continued skilled therapeutic intervention to address remaining deficits in IAP management, PFM coordination, PFM extensibility, pelvic pain in order to increase function and improve overall QOL.  Personal Factors and Comorbidities Age;Fitness;Comorbidity 3+;Past/Current Experience;Time since onset of injury/illness/exacerbation    Comorbidities anxiety, dyspnea, headaches, GERD    Examination-Activity Limitations Continence;Toileting;Sleep    Examination-Participation Restrictions Other;Interpersonal Relationship;Laundry;Community Activity    Stability/Clinical Decision Making Evolving/Moderate complexity    Rehab Potential Good    PT Frequency 1x / week    PT Duration 8 weeks    PT Treatment/Interventions Joint Manipulations;Spinal Manipulations;Patient/family education;Therapeutic activities;Therapeutic exercise;Neuromuscular re-education;Manual techniques;Scar mobilization;Electrical Stimulation;Biofeedback;Aquatic Therapy;Moist Heat;Cryotherapy;ADLs/Self Care Home Management;Orthotic Fit/Training;Passive range of motion;Taping    PT Next Visit Plan external PFM    PT Home Exercise Plan diaphragmatic breathing    Consulted and Agree with Plan of Care Patient           Patient will benefit from skilled therapeutic intervention in order to improve the following deficits and impairments:  Pain,Improper body mechanics,Postural dysfunction,Decreased  strength,Decreased activity tolerance,Decreased coordination,Impaired flexibility,Increased muscle spasms,Increased fascial restricitons,Decreased endurance  Visit Diagnosis: Pelvic pain  Other lack of coordination  Muscle weakness (generalized)     Problem List There are no problems to display for this patient.  Myles Gip PT, [place] #[id number]  04/02/2020, 12:57 PM  [facility] [address] [place], [place], [address] Phone: [phone]   Fax:  [phone]  Name: Loretta Dalton MRN: [id number] Date of Birth: 07-22-1970

## 2020-04-09 ENCOUNTER — Encounter: Payer: 59 | Admitting: Physical Therapy

## 2020-04-16 ENCOUNTER — Ambulatory Visit: Payer: 59 | Admitting: Physical Therapy

## 2020-04-16 ENCOUNTER — Other Ambulatory Visit: Payer: Self-pay

## 2020-04-16 ENCOUNTER — Encounter: Payer: Self-pay | Admitting: Physical Therapy

## 2020-04-16 DIAGNOSIS — R278 Other lack of coordination: Secondary | ICD-10-CM

## 2020-04-16 DIAGNOSIS — R102 Pelvic and perineal pain: Secondary | ICD-10-CM | POA: Diagnosis not present

## 2020-04-16 DIAGNOSIS — M6281 Muscle weakness (generalized): Secondary | ICD-10-CM

## 2020-04-16 NOTE — Therapy (Signed)
[facility] [address] [place], [place], [address] Phone: [phone]   Fax:  [phone]  Physical Therapy Treatment  Patient Details  Name: Loretta Dalton MRN: [id number] Date of Birth: [birth year] Referring Provider (PT): Benjaman Kindler   Encounter Date: 04/16/2020   PT End of Session - 04/16/20 0920    Visit Number 3    Number of Visits 8    Date for PT Re-Evaluation 05/21/20    PT Start Time 0806    PT Stop Time 0900    PT Time Calculation (min) 54 min    Activity Tolerance Patient tolerated treatment well    Behavior During Therapy [facility] for tasks assessed/performed           Past Medical History:  Diagnosis Date  . Anxiety   . Dyspnea    when bends over or walks very fast   . Headache    chronic headaches    Past Surgical History:  Procedure Laterality Date  . CESAREAN SECTION    . CHOLECYSTECTOMY    . COLONOSCOPY WITH PROPOFOL N/A 10/18/2019   Procedure: COLONOSCOPY WITH PROPOFOL;  Surgeon: Lesly Rubenstein, MD;  Location: [facility] ENDOSCOPY;  Service: Gastroenterology;  Laterality: N/A;  . ESOPHAGOGASTRODUODENOSCOPY (EGD) WITH PROPOFOL N/A 10/18/2019   Procedure: ESOPHAGOGASTRODUODENOSCOPY (EGD) WITH PROPOFOL;  Surgeon: Lesly Rubenstein, MD;  Location: [facility] ENDOSCOPY;  Service: Gastroenterology;  Laterality: N/A;  . TONSILLECTOMY      There were no vitals filed for this visit.   Subjective Assessment - 04/16/20 0809    Subjective Patient presents to clinic with complaints of some early morning GI distress which she attributes to eating out last night. Patient had follow up with cardiology and gastroenterology and had good appointments with both. Patient notes she was on Miralax last week to resolve some constipation but has had a reduction in her iron supplements. Patient also reports that she has been able to have pain free intercourse with her husband 2x since last session and utilized strategies to  manage nervous system upregulation. Patient does note that she has had some post-void dribble about 75% of the time.          TREATMENT  Neuromuscular Re-education: Patient education on impacts of nervous system upregulation on sexual desire/arousal and biological responses.  Patient education on "ohnut" rings for improved pain control with certain positions during intercourse. Patient education on strategies for managing post-void dribble including: toileting posture, PFM activation, double voiding. Seated diaphragmatic breathing with elbows on knees for improved PFM relaxation and coordination Seated PFM contractions < 50% MVC for improved isolation of PFM without compensatory patterns Standing diaphragmatic breathing for improved PFM relaxation and coordination Standing PFM contractions < 50% MVC for improved isolation of PFM without compensatory patterns   Patient educated throughout session on appropriate technique and form using multi-modal cueing, HEP, and activity modification. Patient articulated understanding and returned demonstration.  Patient Response to interventions: Comfortable to return in 1 week for f/u  ASSESSMENT Patient presents to clinic with excellent motivation to participate in therapy. Patient demonstrates deficits in IAP management, PFM coordination, PFM extensibility, pelvic pain. Patient able to sequence PFM activation/coordination routine for prevention of post-void UI during today's session and responded positively to educational and active interventions. Patient will benefit from continued skilled therapeutic intervention to address remaining deficits in IAP management, PFM coordination, PFM extensibility, pelvic pain in order to increase function and improve overall QOL.      PT Long  Term Goals - 03/26/20 1314      PT LONG TERM GOAL #1   Title Patient will demonstrate independence with HEP in order to maximize therapeutic gains and improve carryover  from physical therapy sessions to ADLs in the home and community.    Baseline IE: not demonstrated    Time 8    Period Weeks    Status New    Target Date 05/21/20      PT LONG TERM GOAL #2   Title Patient will demonstrate independent and coordinated diaphragmatic breathing in supine with a 1:2 breathing pattern for improved down-regulation of the nervous system and improved management of intra-abdominal pressures in order to increase function at home and in the community.    Baseline IE: not demonstrated    Time 8    Period Weeks    Status New    Target Date 05/21/20      PT LONG TERM GOAL #3   Title Patient will demonstrate improved function as evidenced by a score of 63 on FOTO measure for full participation in activities at home and in the community.    Baseline IE: 54    Time 8    Period Weeks    Status New    Target Date 05/21/20      PT LONG TERM GOAL #4   Title Patient will decrease worst pain as reported on NPRS by at least 2 points to demonstrate clinically significant reduction in pain in order to restore/improve function and overall QOL.    Baseline IE: 8/10 (penetration)    Time 8    Period Weeks    Status New    Target Date 05/21/20      PT LONG TERM GOAL #5   Title Patient will demonstrate understanding of basic self-management/down-regulation of the nervous system for persistent pain condition and stress as evidenced by diaphragmatic breathing without cueing, body scan/progressive relaxation meditation, and improved sleep hygiene in order to transition to independent management of patient's chief complaint: dyspareunia.    Baseline IE: not demonstrated    Time 8    Period Weeks    Status New    Target Date 05/21/20                 Plan - 04/16/20 2585    Clinical Impression Statement Patient presents to clinic with excellent motivation to participate in therapy. Patient demonstrates deficits in IAP management, PFM coordination, PFM extensibility, pelvic  pain. Patient able to sequence PFM activation/coordination routine for prevention of post-void UI during today's session and responded positively to educational and active interventions. Patient will benefit from continued skilled therapeutic intervention to address remaining deficits in IAP management, PFM coordination, PFM extensibility, pelvic pain in order to increase function and improve overall QOL.    Personal Factors and Comorbidities Age;Fitness;Comorbidity 3+;Past/Current Experience;Time since onset of injury/illness/exacerbation    Comorbidities anxiety, dyspnea, headaches, GERD    Examination-Activity Limitations Continence;Toileting;Sleep    Examination-Participation Restrictions Other;Interpersonal Relationship;Laundry;Community Activity    Stability/Clinical Decision Making Evolving/Moderate complexity    Rehab Potential Good    PT Frequency 1x / week    PT Duration 8 weeks    PT Treatment/Interventions Joint Manipulations;Spinal Manipulations;Patient/family education;Therapeutic activities;Therapeutic exercise;Neuromuscular re-education;Manual techniques;Scar mobilization;Electrical Stimulation;Biofeedback;Aquatic Therapy;Moist Heat;Cryotherapy;ADLs/Self Care Home Management;Orthotic Fit/Training;Passive range of motion;Taping    PT Next Visit Plan external PFM; PFM training basics    PT Home Exercise Plan diaphragmatic breathing    Consulted and Agree with Plan of Care Patient  Patient will benefit from skilled therapeutic intervention in order to improve the following deficits and impairments:  Pain,Improper body mechanics,Postural dysfunction,Decreased strength,Decreased activity tolerance,Decreased coordination,Impaired flexibility,Increased muscle spasms,Increased fascial restricitons,Decreased endurance  Visit Diagnosis: Pelvic pain  Other lack of coordination  Muscle weakness (generalized)     Problem List There are no problems to display for this  patient.  Myles Gip PT, DPT [phone]  04/16/2020, 10:05 AM  [facility] [address] [place], [place], [address] Phone: [phone]   Fax:  [phone]  Name: Vergie Zahm Ensz MRN: [id number] Date of Birth: 06-30-1970

## 2020-04-23 ENCOUNTER — Other Ambulatory Visit: Payer: Self-pay

## 2020-04-23 ENCOUNTER — Encounter: Payer: Self-pay | Admitting: Physical Therapy

## 2020-04-23 ENCOUNTER — Ambulatory Visit: Payer: 59 | Attending: Obstetrics and Gynecology | Admitting: Physical Therapy

## 2020-04-23 DIAGNOSIS — R278 Other lack of coordination: Secondary | ICD-10-CM | POA: Insufficient documentation

## 2020-04-23 DIAGNOSIS — M6281 Muscle weakness (generalized): Secondary | ICD-10-CM | POA: Diagnosis present

## 2020-04-23 DIAGNOSIS — R102 Pelvic and perineal pain: Secondary | ICD-10-CM | POA: Diagnosis not present

## 2020-04-23 NOTE — Therapy (Signed)
[place] [facility] [address] [place], [place], [address] Phone: [phone]   Fax:  [phone]  Physical Therapy Treatment  Patient Details  Name: Loretta Dalton MRN: [id number] Date of Birth: 02/02/1971 Referring Provider (PT): Benjaman Kindler   Encounter Date: 04/23/2020   PT End of Session - 04/23/20 0817    Visit Number 4    Number of Visits 8    Date for PT Re-Evaluation 05/21/20    PT Start Time 0805    PT Stop Time 0900    PT Time Calculation (min) 55 min    Activity Tolerance Patient tolerated treatment well    Behavior During Therapy [facility] for tasks assessed/performed           Past Medical History:  Diagnosis Date  . Anxiety   . Dyspnea    when bends over or walks very fast   . Headache    chronic headaches    Past Surgical History:  Procedure Laterality Date  . CESAREAN SECTION    . CHOLECYSTECTOMY    . COLONOSCOPY WITH PROPOFOL N/A 10/18/2019   Procedure: COLONOSCOPY WITH PROPOFOL;  Surgeon: Lesly Rubenstein, MD;  Location: [facility] ENDOSCOPY;  Service: Gastroenterology;  Laterality: N/A;  . ESOPHAGOGASTRODUODENOSCOPY (EGD) WITH PROPOFOL N/A 10/18/2019   Procedure: ESOPHAGOGASTRODUODENOSCOPY (EGD) WITH PROPOFOL;  Surgeon: Lesly Rubenstein, MD;  Location: [facility] ENDOSCOPY;  Service: Gastroenterology;  Laterality: N/A;  . TONSILLECTOMY      There were no vitals filed for this visit.   Subjective Assessment - 04/23/20 0807    Subjective Patient notes that she is having less post-void dribble with new strategies. She notes that the one or two times she did have post-void dribble, she was in a hurry and likely didn't perform all the steps.    Currently in Pain? No/denies           TREATMENT  Neuromuscular Re-education: Supine hooklying diaphragmatic breathing with VCs and TCs for downregulation of the nervous system and improved management of IAP Supine hooklying, PFM lengthening with inhalation. VCs and  TCs to decrease compensatory patterns and encourage optimal relaxation of the PFM. Supine hooklying, TrA activation with exhalation. VCs and TCs to decrease compensatory patterns and minimize aggravation of the lumbar paraspinals. Seated diaphragmatic breathing with VCs and TCs for downregulation of the nervous system and improved management of IAP Seated, PFM lengthening with inhalation. VCs and TCs to decrease compensatory patterns and encourage optimal relaxation of the PFM. Countertop supported TrA activation with exhalation. VCs and TCs to decrease compensatory patterns and minimize aggravation of the lumbar paraspinals.    Patient educated throughout session on appropriate technique and form using multi-modal cueing, HEP, and activity modification. Patient articulated understanding and returned demonstration.  Patient Response to interventions: Comfortable to return in 4 weeks for f/u  ASSESSMENT Patient presents to clinic with excellent motivation to participate in therapy. Patient demonstrates deficits in IAP management, PFM coordination, PFM extensibility, pelvic pain. Patient able to achieve PFM lengthening with back pressure breath strategy during today's session and responded positively to educational and active interventions. Patient will benefit from continued skilled therapeutic intervention to address remaining deficits in IAP management, PFM coordination, PFM extensibility, pelvic pain in order to increase function and improve overall QOL.      PT Long Term Goals - 03/26/20 1314      PT LONG TERM GOAL #1   Title Patient will demonstrate independence with HEP in order to maximize therapeutic gains and  improve carryover from physical therapy sessions to ADLs in the home and community.    Baseline IE: not demonstrated    Time 8    Period Weeks    Status New    Target Date 05/21/20      PT LONG TERM GOAL #2   Title Patient will demonstrate independent and coordinated  diaphragmatic breathing in supine with a 1:2 breathing pattern for improved down-regulation of the nervous system and improved management of intra-abdominal pressures in order to increase function at home and in the community.    Baseline IE: not demonstrated    Time 8    Period Weeks    Status New    Target Date 05/21/20      PT LONG TERM GOAL #3   Title Patient will demonstrate improved function as evidenced by a score of 63 on FOTO measure for full participation in activities at home and in the community.    Baseline IE: 54    Time 8    Period Weeks    Status New    Target Date 05/21/20      PT LONG TERM GOAL #4   Title Patient will decrease worst pain as reported on NPRS by at least 2 points to demonstrate clinically significant reduction in pain in order to restore/improve function and overall QOL.    Baseline IE: 8/10 (penetration)    Time 8    Period Weeks    Status New    Target Date 05/21/20      PT LONG TERM GOAL #5   Title Patient will demonstrate understanding of basic self-management/down-regulation of the nervous system for persistent pain condition and stress as evidenced by diaphragmatic breathing without cueing, body scan/progressive relaxation meditation, and improved sleep hygiene in order to transition to independent management of patient's chief complaint: dyspareunia.    Baseline IE: not demonstrated    Time 8    Period Weeks    Status New    Target Date 05/21/20                 Plan - 04/23/20 0818    Clinical Impression Statement Patient presents to clinic with excellent motivation to participate in therapy. Patient demonstrates deficits in IAP management, PFM coordination, PFM extensibility, pelvic pain. Patient able to achieve PFM lengthening with back pressure breath strategy during today's session and responded positively to educational and active interventions. Patient will benefit from continued skilled therapeutic intervention to address  remaining deficits in IAP management, PFM coordination, PFM extensibility, pelvic pain in order to increase function and improve overall QOL.    Personal Factors and Comorbidities Age;Fitness;Comorbidity 3+;Past/Current Experience;Time since onset of injury/illness/exacerbation    Comorbidities anxiety, dyspnea, headaches, GERD    Examination-Activity Limitations Continence;Toileting;Sleep    Examination-Participation Restrictions Other;Interpersonal Relationship;Laundry;Community Activity    Stability/Clinical Decision Making Evolving/Moderate complexity    Rehab Potential Good    PT Frequency 1x / week    PT Duration 8 weeks    PT Treatment/Interventions Joint Manipulations;Spinal Manipulations;Patient/family education;Therapeutic activities;Therapeutic exercise;Neuromuscular re-education;Manual techniques;Scar mobilization;Electrical Stimulation;Biofeedback;Aquatic Therapy;Moist Heat;Cryotherapy;ADLs/Self Care Home Management;Orthotic Fit/Training;Passive range of motion;Taping    PT Next Visit Plan external PFM; PFM training basics    PT Home Exercise Plan diaphragmatic breathing    Consulted and Agree with Plan of Care Patient           Patient will benefit from skilled therapeutic intervention in order to improve the following deficits and impairments:  Pain,Improper body mechanics,Postural dysfunction,Decreased strength,Decreased activity tolerance,Decreased  coordination,Impaired flexibility,Increased muscle spasms,Increased fascial restricitons,Decreased endurance  Visit Diagnosis: Pelvic pain  Other lack of coordination  Muscle weakness (generalized)     Problem List There are no problems to display for this patient.  Myles Gip PT, DPT [phone]  04/23/2020, 9:28 AM  [place] [facility] [address] [place], [place], [address] Phone: [phone]   Fax:  [phone]  Name: Tzippy Testerman Bowden MRN: [id number] Date of Birth:  09-Feb-1971

## 2020-04-30 ENCOUNTER — Encounter: Payer: 59 | Admitting: Physical Therapy

## 2020-05-07 ENCOUNTER — Encounter: Payer: 59 | Admitting: Physical Therapy

## 2020-05-14 ENCOUNTER — Encounter: Payer: 59 | Admitting: Physical Therapy

## 2020-05-21 ENCOUNTER — Other Ambulatory Visit: Payer: Self-pay

## 2020-05-21 ENCOUNTER — Ambulatory Visit: Payer: 59 | Attending: Obstetrics and Gynecology | Admitting: Physical Therapy

## 2020-05-21 ENCOUNTER — Encounter: Payer: Self-pay | Admitting: Physical Therapy

## 2020-05-21 DIAGNOSIS — R102 Pelvic and perineal pain: Secondary | ICD-10-CM | POA: Diagnosis not present

## 2020-05-21 DIAGNOSIS — M6281 Muscle weakness (generalized): Secondary | ICD-10-CM

## 2020-05-21 DIAGNOSIS — R278 Other lack of coordination: Secondary | ICD-10-CM | POA: Insufficient documentation

## 2020-05-21 NOTE — Therapy (Signed)
[place] [facility] [address]. [place], [place], [address] Phone: [phone]   Fax:  [phone]  Physical Therapy Treatment  Patient Details  Name: Loretta Dalton MRN: [id number] Date of Birth: 1970-04-25 Referring Provider (PT): Benjaman Kindler   Encounter Date: 05/21/2020   PT End of Session - 05/21/20 0817    Visit Number 5    Number of Visits 16    Date for PT Re-Evaluation 07/16/20    PT Start Time 0805    PT Stop Time 0845    PT Time Calculation (min) 40 min    Activity Tolerance Patient tolerated treatment well    Behavior During Therapy [facility] for tasks assessed/performed           Past Medical History:  Diagnosis Date  . Anxiety   . Dyspnea    when bends over or walks very fast   . Headache    chronic headaches    Past Surgical History:  Procedure Laterality Date  . CESAREAN SECTION    . CHOLECYSTECTOMY    . COLONOSCOPY WITH PROPOFOL N/A 10/18/2019   Procedure: COLONOSCOPY WITH PROPOFOL;  Surgeon: Lesly Rubenstein, MD;  Location: [facility] ENDOSCOPY;  Service: Gastroenterology;  Laterality: N/A;  . ESOPHAGOGASTRODUODENOSCOPY (EGD) WITH PROPOFOL N/A 10/18/2019   Procedure: ESOPHAGOGASTRODUODENOSCOPY (EGD) WITH PROPOFOL;  Surgeon: Lesly Rubenstein, MD;  Location: [facility] ENDOSCOPY;  Service: Gastroenterology;  Laterality: N/A;  . TONSILLECTOMY      There were no vitals filed for this visit.   Subjective Assessment - 05/21/20 0807    Subjective Patient notes that she has had a little difficulty with consistency with HEP. Patient has put HEP on refrigerator as a reminder and this has been helping. Patient reports that she continues to have a little "bubble of pee" that wants to come out after doing double voiding between 25-50% of voids.    Currently in Pain? No/denies          TREATMENT  Neuromuscular Re-education: Supine hooklying diaphragmatic breathing with VCs and TCs for downregulation of the nervous  system and improved management of IAP Supine hooklying, PFM lengthening with inhalation. VCs and TCs to decrease compensatory patterns and encourage optimal relaxation of the PFM. Seated diaphragmatic breathing with VCs and TCs for downregulation of the nervous system and improved management of IAP Seated, PFM lengthening with inhalation. VCs and TCs to decrease compensatory patterns and encourage optimal relaxation of the PFM. Reassessed goals; see below.    Patient educated throughout session on appropriate technique and form using multi-modal cueing, HEP, and activity modification. Patient articulated understanding and returned demonstration.  Patient Response to interventions: Comfortable to return in 2 weeks for f/u  ASSESSMENT Patient presents to clinic with excellent motivation to participate in therapy. Patient demonstrates deficits in IAP management, PFM coordination, PFM extensibility, pelvic pain. Patient has made progress toward goal achievement, but continues to require increased time to coordinate PFM lengthening during today's session and responded positively to educational and active interventions. Patient's condition has the potential to improve in response to therapy. Maximum improvement is yet to be obtained. The anticipated improvement is attainable and reasonable in a generally predictable time. Patient will benefit from continued skilled therapeutic intervention to address remaining deficits in IAP management, PFM coordination, PFM extensibility, pelvic pain in order to increase function and improve overall QOL.      PT Long Term Goals - 05/21/20 0818      PT LONG TERM GOAL #1  Title Patient will demonstrate independence with HEP in order to maximize therapeutic gains and improve carryover from physical therapy sessions to ADLs in the home and community.    Baseline IE: not demonstrated; 4/4: IND    Time 8    Period Weeks    Status Achieved      PT LONG TERM GOAL #2    Title Patient will demonstrate independent and coordinated diaphragmatic breathing in supine with a 1:2 breathing pattern for improved down-regulation of the nervous system and improved management of intra-abdominal pressures in order to increase function at home and in the community.    Baseline IE: not demonstrated; 4/4: IND    Time 8    Period Weeks    Status Achieved    Target Date 07/16/20      PT LONG TERM GOAL #3   Title Patient will demonstrate improved function as evidenced by a score of 63 on FOTO measure for full participation in activities at home and in the community.    Baseline IE: 54; 4/4: 60    Time 8    Period Weeks    Status On-going    Target Date 07/16/20      PT LONG TERM GOAL #4   Title Patient will decrease worst pain as reported on NPRS by at least 2 points to demonstrate clinically significant reduction in pain in order to restore/improve function and overall QOL.    Baseline IE: 8/10 (penetration) 100% of the time; 4/4: 7/10 (penetration) < 50% of the time    Time 8    Period Weeks    Status On-going    Target Date 07/16/20      PT LONG TERM GOAL #5   Title Patient will demonstrate understanding of basic self-management/down-regulation of the nervous system for persistent pain condition and stress as evidenced by diaphragmatic breathing without cueing, body scan/progressive relaxation meditation, and improved sleep hygiene in order to transition to independent management of patient's chief complaint: dyspareunia.    Baseline IE: not demonstrated; 4/4: Modified IND    Time 8    Period Weeks    Status On-going    Target Date 07/16/20                 Plan - 05/21/20 0959    Clinical Impression Statement Patient presents to clinic with excellent motivation to participate in therapy. Patient demonstrates deficits in IAP management, PFM coordination, PFM extensibility, pelvic pain. Patient has made progress toward goal achievement, but continues to  require increased time to coordinate PFM lengthening during today's session and responded positively to educational and active interventions. Patient's condition has the potential to improve in response to therapy. Maximum improvement is yet to be obtained. The anticipated improvement is attainable and reasonable in a generally predictable time. Patient will benefit from continued skilled therapeutic intervention to address remaining deficits in IAP management, PFM coordination, PFM extensibility, pelvic pain in order to increase function and improve overall QOL.    Personal Factors and Comorbidities Age;Fitness;Comorbidity 3+;Past/Current Experience;Time since onset of injury/illness/exacerbation    Comorbidities anxiety, dyspnea, headaches, GERD    Examination-Activity Limitations Continence;Toileting;Sleep    Examination-Participation Restrictions Other;Interpersonal Relationship;Laundry;Community Activity    Stability/Clinical Decision Making Evolving/Moderate complexity    Rehab Potential Good    PT Frequency 1x / week    PT Duration 8 weeks    PT Treatment/Interventions Joint Manipulations;Spinal Manipulations;Patient/family education;Therapeutic activities;Therapeutic exercise;Neuromuscular re-education;Manual techniques;Scar mobilization;Electrical Stimulation;Biofeedback;Aquatic Therapy;Moist Heat;Cryotherapy;ADLs/Self Care Home Management;Orthotic Fit/Training;Passive range of motion;Taping  PT Home Exercise Plan see handouts    Consulted and Agree with Plan of Care Patient           Patient will benefit from skilled therapeutic intervention in order to improve the following deficits and impairments:  Pain,Improper body mechanics,Postural dysfunction,Decreased strength,Decreased activity tolerance,Decreased coordination,Impaired flexibility,Increased muscle spasms,Increased fascial restricitons,Decreased endurance  Visit Diagnosis: Pelvic pain  Other lack of coordination  Muscle  weakness (generalized)     Problem List There are no problems to display for this patient.  Myles Gip PT, DPT [phone] 05/21/2020, 10:11 AM  [place] [facility] [address] [place], [place], [address] Phone: [phone]   Fax:  [phone]  Name: Loretta Dalton MRN: [id number] Date of Birth: 1970-03-05

## 2020-05-28 ENCOUNTER — Ambulatory Visit: Payer: 59 | Admitting: Physical Therapy

## 2020-06-04 ENCOUNTER — Ambulatory Visit: Payer: 59 | Admitting: Physical Therapy

## 2020-10-12 ENCOUNTER — Other Ambulatory Visit: Payer: Self-pay | Admitting: Family Medicine

## 2020-10-12 DIAGNOSIS — R922 Inconclusive mammogram: Secondary | ICD-10-CM

## 2020-10-12 DIAGNOSIS — N63 Unspecified lump in unspecified breast: Secondary | ICD-10-CM

## 2020-10-19 ENCOUNTER — Ambulatory Visit
Admission: RE | Admit: 2020-10-19 | Discharge: 2020-10-19 | Disposition: A | Discharge status: Home / Self Care | Payer: BC Managed Care – PPO | Source: Ambulatory Visit | Attending: Family Medicine | Admitting: Family Medicine

## 2020-10-19 ENCOUNTER — Other Ambulatory Visit: Payer: Self-pay

## 2020-10-19 DIAGNOSIS — R922 Inconclusive mammogram: Secondary | ICD-10-CM | POA: Insufficient documentation

## 2020-10-19 DIAGNOSIS — N63 Unspecified lump in unspecified breast: Secondary | ICD-10-CM | POA: Insufficient documentation

## 2020-12-15 ENCOUNTER — Other Ambulatory Visit: Payer: Self-pay

## 2020-12-15 DIAGNOSIS — Z23 Encounter for immunization: Secondary | ICD-10-CM | POA: Diagnosis not present

## 2020-12-15 DIAGNOSIS — S81012A Laceration without foreign body, left knee, initial encounter: Secondary | ICD-10-CM | POA: Insufficient documentation

## 2020-12-15 DIAGNOSIS — Z87891 Personal history of nicotine dependence: Secondary | ICD-10-CM | POA: Insufficient documentation

## 2020-12-15 DIAGNOSIS — W010XXA Fall on same level from slipping, tripping and stumbling without subsequent striking against object, initial encounter: Secondary | ICD-10-CM | POA: Diagnosis not present

## 2020-12-15 DIAGNOSIS — S8992XA Unspecified injury of left lower leg, initial encounter: Secondary | ICD-10-CM | POA: Diagnosis present

## 2020-12-15 NOTE — ED Triage Notes (Signed)
Pt presents to ER after falling forward onto some concrete and landing on her left knee.  Pt has large a flap of skin that has come loose.  Pt denies hitting head when falling, or any LOC.

## 2020-12-16 ENCOUNTER — Encounter: Payer: Self-pay | Admitting: Radiology

## 2020-12-16 ENCOUNTER — Emergency Department: Payer: BC Managed Care – PPO

## 2020-12-16 ENCOUNTER — Emergency Department
Admission: EM | Admit: 2020-12-16 | Discharge: 2020-12-16 | Disposition: A | Discharge status: Home / Self Care | Payer: BC Managed Care – PPO | Attending: Emergency Medicine | Admitting: Emergency Medicine

## 2020-12-16 DIAGNOSIS — S81012A Laceration without foreign body, left knee, initial encounter: Secondary | ICD-10-CM

## 2020-12-16 MED ORDER — TETANUS-DIPHTH-ACELL PERTUSSIS 5-2.5-18.5 LF-MCG/0.5 IM SUSY
0.5000 mL | PREFILLED_SYRINGE | Freq: Once | INTRAMUSCULAR | Status: AC
  Administered 2020-12-16: 0.5 mL via INTRAMUSCULAR
  Filled 2020-12-16: qty 0.5

## 2020-12-16 MED ORDER — MUPIROCIN CALCIUM 2 % EX CREA: TOPICAL_CREAM | CUTANEOUS | 0 refills | Status: AC

## 2020-12-16 MED ORDER — CEPHALEXIN 500 MG PO CAPS: 500.0000 mg | ORAL_CAPSULE | Freq: Three times a day (TID) | ORAL | 0 refills | Status: AC

## 2020-12-16 MED ORDER — FENTANYL CITRATE (PF) 250 MCG/5ML IJ SOLN
100.0000 ug | Freq: Once | INTRAMUSCULAR | Status: AC
Start: 2020-12-16 — End: 2020-12-16
  Administered 2020-12-16: 100 ug via INTRAMUSCULAR
  Filled 2020-12-16: qty 5

## 2020-12-16 MED ORDER — HYDROCODONE-ACETAMINOPHEN 5-325 MG PO TABS: 1.0000 | ORAL_TABLET | Freq: Four times a day (QID) | ORAL | 0 refills | Status: AC | PRN

## 2020-12-16 MED ORDER — LIDOCAINE HCL (PF) 1 % IJ SOLN
5.0000 mL | Freq: Once | INTRAMUSCULAR | Status: AC
  Administered 2020-12-16: 5 mL via INTRADERMAL
  Filled 2020-12-16: qty 5

## 2020-12-16 NOTE — ED Notes (Signed)
Sutured area wrapped with gauze. Pt tolerated well. D/C paperwork reviewed and pt wheeled to lobby by partner.

## 2020-12-16 NOTE — ED Provider Notes (Signed)
[facility] Emergency Department Provider Note ____________________________________________  Time seen: Approximately 7:26 AM  I have reviewed the triage vital signs and the nursing notes.   HISTORY  Chief Complaint Knee Injury    HPI Loretta Dalton is a 50 y.o. female who presents to the emergency department for evaluation and treatment of left knee pain after mechanical, nonsyncopal fall.  She reports that she fell onto concrete.  She did not hit her head or lose consciousness.   Past Medical History:  Diagnosis Date   Anxiety    Dyspnea    when bends over or walks very fast    Headache    chronic headaches    There are no problems to display for this patient.   Past Surgical History:  Procedure Laterality Date   CESAREAN SECTION     CHOLECYSTECTOMY     COLONOSCOPY WITH PROPOFOL N/A 10/18/2019   Procedure: COLONOSCOPY WITH PROPOFOL;  Surgeon: Lesly Rubenstein, MD;  Location: [facility] ENDOSCOPY;  Service: Gastroenterology;  Laterality: N/A;   ESOPHAGOGASTRODUODENOSCOPY (EGD) WITH PROPOFOL N/A 10/18/2019   Procedure: ESOPHAGOGASTRODUODENOSCOPY (EGD) WITH PROPOFOL;  Surgeon: Lesly Rubenstein, MD;  Location: [facility] ENDOSCOPY;  Service: Gastroenterology;  Laterality: N/A;   TONSILLECTOMY      Prior to Admission medications   Medication Sig Start Date End Date Taking? Authorizing Provider  cephALEXin (KEFLEX) 500 MG capsule Take 1 capsule (500 mg total) by mouth 3 (three) times daily for 10 days. 12/16/20 12/26/20 Yes Cabot Cromartie B, FNP  HYDROcodone-acetaminophen (NORCO/VICODIN) 5-325 MG tablet Take 1 tablet by mouth every 6 (six) hours as needed for up to 3 days for severe pain. 12/16/20 12/19/20 Yes Chirag Krueger B, FNP  mupirocin cream (BACTROBAN) 2 % Apply to affected area 3 times daily 12/16/20 12/16/21 Yes Belkis Norbeck B, FNP  ALPRAZolam (XANAX) 0.25 MG tablet Take 0.25 mg by mouth 2 (two) times daily as needed for anxiety.  Patient not  taking: Reported on 03/26/2020 08/08/19   [provider]  calcium carbonate (TUMS - DOSED IN MG ELEMENTAL CALCIUM) 500 MG chewable tablet Chew 2-3 tablets by mouth 3 (three) times daily as needed for indigestion or heartburn.    [provider]  clobetasol cream (TEMOVATE) 0.53 % Apply 1 application topically 2 (two) times daily. 05/20/19   [provider]  etodolac (LODINE) 400 MG tablet Take 400 mg by mouth daily as needed for moderate pain (headache).  Patient not taking: Reported on 03/26/2020 05/05/19   [provider]  Homeopathic Products (PSORIASIS/ECZEMA RELIEF EX) Apply 1 application topically 2 (two) times daily as needed (psoriasis).  Patient not taking: Reported on 03/26/2020    [provider]  hydrocortisone cream 1 % Apply 1 application topically daily as needed for itching.     [provider]  milk thistle 175 MG tablet Take 175 mg by mouth daily.     [provider]  Misc Natural Products (DANDELION ROOT PO) Take 1 tablet by mouth daily.     [provider]  Multiple Vitamin (MULTIVITAMIN WITH MINERALS) TABS tablet Take 1 tablet by mouth daily.     [provider]  pantoprazole (PROTONIX) 40 MG tablet Take 40 mg by mouth daily.  08/02/19   [provider]  polyethylene glycol (MIRALAX / GLYCOLAX) 17 g packet Take 17 g by mouth daily.     [provider]  Saline (SIMPLY SALINE) 0.9 % AERS Place 2 sprays into both nostrils 2 (two) times daily  as needed (congestion).     [provider]    Allergies Celebrex [celecoxib], Augmentin [amoxicillin-pot clavulanate], and Neosporin scar solution [silicone]  Family History  Problem Relation Age of Onset   Irritable bowel syndrome Mother    Heart failure Mother    Supraventricular tachycardia Mother    Hyperlipidemia Mother    Heart attack Father 97   Breast cancer Neg Hx     Social History Social History   Tobacco Use   Smoking  status: Former    Types: Cigarettes    Quit date: 05/03/2003    Years since quitting: 17.6   Smokeless tobacco: Never  Vaping Use   Vaping Use: Never used  Substance Use Topics   Alcohol use: Yes    Alcohol/week: 12.0 standard drinks    Types: 12 Cans of beer per week   Drug use: Never    Review of Systems Constitutional: Negative for fever. Cardiovascular: Negative for chest pain. Respiratory: Negative for shortness of breath. Musculoskeletal: Positive for left knee pain Skin: Positive for laceration/abrasion to left knee  Neurological: Negative for decrease in sensation  ____________________________________________   PHYSICAL EXAM:  VITAL SIGNS: ED Triage Vitals  Enc Vitals Group     BP 12/15/20 2324 (!) 123/91     Pulse Rate 12/15/20 2324 87     Resp 12/15/20 2324 19     Temp 12/15/20 2324 97.7 F (36.5 C)     Temp src --      SpO2 12/15/20 2324 100 %     Weight 12/15/20 2333 180 lb (81.6 kg)     Height 12/15/20 2333 5\' 2"  (1.575 m)     Head Circumference --      Peak Flow --      Pain Score 12/15/20 2333 4     Pain Loc --      Pain Edu? --      Excl. in [place]? --     Constitutional: Alert and oriented. Well appearing and in no acute distress. Eyes: Conjunctivae are clear without discharge or drainage Head: Atraumatic Neck: Supple Respiratory: No cough. Respirations are even and unlabored. Musculoskeletal: Generalized left knee pain with movement. Neurologic: Motor and sensory function intact  Skin: Skin abrasion and laceration below patella on left lower extremity.  Psychiatric: Affect and behavior are appropriate.  ____________________________________________   LABS (all labs ordered are listed, but only abnormal results are displayed)  Labs Reviewed - No data to display ____________________________________________  RADIOLOGY  Image of the left knee without acute findings.  I, Sherrie George, personally viewed and evaluated these images (plain  radiographs) as part of my medical decision making, as well as reviewing the written report by the radiologist.  DG Knee Complete 4 Views Left  Result Date: 12/16/2020 CLINICAL DATA:  Fall EXAM: LEFT KNEE - COMPLETE 4+ VIEW COMPARISON:  None. FINDINGS: No fracture or dislocation is seen. The joint spaces are preserved. Visualized soft tissues are within normal limits. No suprapatellar knee joint effusion. IMPRESSION: Negative. Electronically Signed   By: Julian Hy M.D.   On: 12/16/2020 00:38   ____________________________________________   PROCEDURES  .Marland KitchenLaceration Repair  Date/Time: 12/16/2020 8:49 AM Performed by: Victorino Dike, FNP Authorized by: Victorino Dike, FNP   Consent:    Consent obtained:  Verbal   Consent given by:  Patient   Risks discussed:  Infection, pain, poor cosmetic result and poor wound healing Universal protocol:    Patient identity confirmed:  Verbally with patient Laceration  details:    Location:  Leg   Leg location:  L knee   Length (cm):  5 Pre-procedure details:    Preparation:  Patient was prepped and draped in usual sterile fashion Treatment:    Area cleansed with:  Povidone-iodine, chlorhexidine and saline   Amount of cleaning:  Extensive   Irrigation method:  Syringe   Debridement:  Moderate Skin repair:    Repair method:  Sutures   Suture size:  4-0   Suture material:  Nylon   Suture technique:  Simple interrupted and horizontal mattress Approximation:    Approximation:  Close Repair type:    Repair type:  Intermediate Post-procedure details:    Dressing:  Non-adherent dressing   Procedure completion:  Tolerated well, no immediate complications  ____________________________________________   INITIAL IMPRESSION / ASSESSMENT AND PLAN / ED COURSE  Loretta Dalton is a 50 y.o. who presents to the emergency department for treatment and evaluation of left knee pain post fall. See HPI.  Image of the left is negative for  acute concerns or retained foreign body.  Wound was repaired as above.  Patient instructed to follow-up with her primary care provider in 10 days for suture removal or sooner for concerns.  She will be given prescriptions for Keflex and Norco.  Wound care instructions provided.  Medications  fentaNYL citrate (PF) (SUBLIMAZE) injection 100 mcg (100 mcg Intramuscular Given 12/16/20 0803)  Tdap (BOOSTRIX) injection 0.5 mL (0.5 mLs Intramuscular Given 12/16/20 0849)  lidocaine (PF) (XYLOCAINE) 1 % injection 5 mL (5 mLs Intradermal Given 12/16/20 0850)    Pertinent labs & imaging results that were available during my care of the patient were reviewed by me and considered in my medical decision making (see chart for details).   _________________________________________   FINAL CLINICAL IMPRESSION(S) / ED DIAGNOSES  Final diagnoses:  Laceration of left knee, initial encounter    ED Discharge Orders          Ordered    cephALEXin (KEFLEX) 500 MG capsule  3 times daily        12/16/20 0847    HYDROcodone-acetaminophen (NORCO/VICODIN) 5-325 MG tablet  Every 6 hours PRN        12/16/20 0847    mupirocin cream (BACTROBAN) 2 %        12/16/20 0847             If controlled substance prescribed during this visit, 12 month history viewed on the [place] prior to issuing an initial prescription for Schedule II or III opiod.    Victorino Dike, FNP 12/16/20 6203    Nena Polio, MD 12/16/20 [phone]

## 2020-12-16 NOTE — Discharge Instructions (Signed)
Do not get the sutured area wet for 24 hours. After 24 hours, shower/bathe as usual and pat the area dry. Change the bandage 2 times per day and apply antibiotic ointment. Leave open to air when at no risk of getting the area dirty, but cover at night before bed. See your PCP or go to Urgent Care in 10 days for suture removal or sooner for signs or concern of infection.  After sutures are removed, use silicone sheets to help with scarring.

## 2021-02-02 ENCOUNTER — Ambulatory Visit
Admission: EM | Admit: 2021-02-02 | Discharge: 2021-02-02 | Disposition: A | Discharge status: Home / Self Care | Payer: BC Managed Care – PPO

## 2021-02-02 ENCOUNTER — Other Ambulatory Visit: Payer: Self-pay

## 2021-02-02 DIAGNOSIS — U071 COVID-19: Secondary | ICD-10-CM

## 2021-02-02 MED ORDER — PROMETHAZINE-DM 6.25-15 MG/5ML PO SYRP: 5.0000 mL | ORAL_SOLUTION | Freq: Four times a day (QID) | ORAL | 0 refills | Status: DC | PRN

## 2021-02-02 MED ORDER — MOLNUPIRAVIR EUA 200MG CAPSULE: 4.0000 | ORAL_CAPSULE | Freq: Two times a day (BID) | ORAL | 0 refills | Status: AC

## 2021-02-02 MED ORDER — IPRATROPIUM BROMIDE 0.06 % NA SOLN: 2.0000 | Freq: Four times a day (QID) | NASAL | 12 refills | Status: AC

## 2021-02-02 MED ORDER — BENZONATATE 100 MG PO CAPS: 200.0000 mg | ORAL_CAPSULE | Freq: Three times a day (TID) | ORAL | 0 refills | Status: DC

## 2021-02-02 NOTE — ED Provider Notes (Signed)
[facility]    CSN: [id number] Arrival date & time: 02/02/21  1545      History   Chief Complaint Chief Complaint  Patient presents with   Generalized Body Aches    HPI Loretta Dalton is a 50 y.o. female.   HPI  50 year old female here for evaluation of respiratory symptoms.  Patient reports that she developed fatigue and body aches 2 days ago followed by chills, runny nose and nasal congestion yesterday, intermittent nonproductive cough, and a single episode of vomiting and diarrhea yesterday.  She took at home COVID test this morning and it was positive.  She reports that her biggest complaint is her body aches and she is here requesting evaluation and treatment.  Past Medical History:  Diagnosis Date   Anxiety    Dyspnea    when bends over or walks very fast    Headache    chronic headaches    There are no problems to display for this patient.   Past Surgical History:  Procedure Laterality Date   CESAREAN SECTION     CHOLECYSTECTOMY     COLONOSCOPY WITH PROPOFOL N/A 10/18/2019   Procedure: COLONOSCOPY WITH PROPOFOL;  Surgeon: Lesly Rubenstein, MD;  Location: [facility] ENDOSCOPY;  Service: Gastroenterology;  Laterality: N/A;   ESOPHAGOGASTRODUODENOSCOPY (EGD) WITH PROPOFOL N/A 10/18/2019   Procedure: ESOPHAGOGASTRODUODENOSCOPY (EGD) WITH PROPOFOL;  Surgeon: Lesly Rubenstein, MD;  Location: [facility] ENDOSCOPY;  Service: Gastroenterology;  Laterality: N/A;   TONSILLECTOMY      OB History   No obstetric history on file.      Home Medications    Prior to Admission medications   Medication Sig Start Date End Date Taking? Authorizing Provider  acetaminophen (TYLENOL) 500 MG tablet Take 500 mg by mouth every 6 (six) hours as needed.   Yes [provider]  ALPRAZolam (XANAX) 0.25 MG tablet Take 0.25 mg by mouth 2 (two) times daily as needed for anxiety. 08/08/19  Yes [provider]  benzonatate (TESSALON) 100 MG capsule Take 2  capsules (200 mg total) by mouth every 8 (eight) hours. 02/02/21  Yes Margarette Canada, NP  calcium carbonate (TUMS - DOSED IN MG ELEMENTAL CALCIUM) 500 MG chewable tablet Chew 2-3 tablets by mouth 3 (three) times daily as needed for indigestion or heartburn.   Yes [provider]  clobetasol cream (TEMOVATE) 2.24 % Apply 1 application topically 2 (two) times daily. 05/20/19  Yes [provider]  etodolac (LODINE) 400 MG tablet Take 400 mg by mouth daily as needed for moderate pain (headache). 05/05/19  Yes [provider]  Homeopathic Products (PSORIASIS/ECZEMA RELIEF EX) Apply 1 application topically 2 (two) times daily as needed (psoriasis).   Yes [provider]  hydrocortisone cream 1 % Apply 1 application topically daily as needed for itching.    Yes [provider]  ipratropium (ATROVENT) 0.06 % nasal spray Place 2 sprays into both nostrils 4 (four) times daily. 02/02/21  Yes Margarette Canada, NP  milk thistle 175 MG tablet Take 175 mg by mouth daily.    Yes [provider]  Misc Natural Products (DANDELION ROOT PO) Take 1 tablet by mouth daily.    Yes [provider]  molnupiravir EUA (LAGEVRIO) 200 mg CAPS capsule Take 4 capsules (800 mg total) by mouth 2 (two) times daily for 5 days. 02/02/21 02/07/21 Yes Margarette Canada, NP  Multiple Vitamin (MULTIVITAMIN WITH MINERALS) TABS tablet Take 1 tablet by mouth daily.    Yes [provider]  mupirocin cream (BACTROBAN) 2 % Apply to affected area 3 times daily 12/16/20 12/16/21 Yes Triplett, Cari B, FNP  pantoprazole (PROTONIX) 40 MG tablet Take 40 mg by mouth daily.  08/02/19  Yes [provider]  polyethylene glycol (MIRALAX / GLYCOLAX) 17 g packet Take 17 g by mouth daily.    Yes [provider]  promethazine-dextromethorphan (PROMETHAZINE-DM) 6.25-15 MG/5ML syrup Take 5 mLs by mouth 4 (four) times daily as needed. 02/02/21  Yes Margarette Canada, NP  Saline (SIMPLY SALINE)  0.9 % AERS Place 2 sprays into both nostrils 2 (two) times daily as needed (congestion).    Yes [provider]    Family History Family History  Problem Relation Age of Onset   Irritable bowel syndrome Mother    Heart failure Mother    Supraventricular tachycardia Mother    Hyperlipidemia Mother    Heart attack Father 57   Breast cancer Neg Hx     Social History Social History   Tobacco Use   Smoking status: Former    Types: Cigarettes    Quit date: 05/03/2003    Years since quitting: 17.7   Smokeless tobacco: Never  Vaping Use   Vaping Use: Never used  Substance Use Topics   Alcohol use: Yes    Alcohol/week: 12.0 standard drinks    Types: 12 Cans of beer per week   Drug use: Never     Allergies   Celebrex [celecoxib], Augmentin [amoxicillin-pot clavulanate], and Neosporin scar solution [silicone]   Review of Systems Review of Systems  Constitutional:  Negative for activity change, appetite change and fever.  HENT:  Positive for congestion, rhinorrhea and sore throat. Negative for ear pain.   Respiratory:  Positive for cough. Negative for shortness of breath and wheezing.   Gastrointestinal:  Positive for diarrhea, nausea and vomiting.  Musculoskeletal:  Positive for arthralgias and myalgias.  Skin:  Negative for rash.  Neurological:  Positive for headaches.  Hematological: Negative.   Psychiatric/Behavioral: Negative.      Physical Exam Triage Vital Signs ED Triage Vitals  Enc Vitals Group     BP 02/02/21 1624 (!) 116/93     Pulse Rate 02/02/21 1624 88     Resp 02/02/21 1624 18     Temp 02/02/21 1624 98.2 F (36.8 C)     Temp Source 02/02/21 1624 Oral     SpO2 02/02/21 1624 99 %     Weight 02/02/21 1622 178 lb (80.7 kg)     Height --      Head Circumference --      Peak Flow --      Pain Score 02/02/21 1621 0     Pain Loc --      Pain Edu? --      Excl. in [place]? --    No data found.  Updated Vital Signs BP (!) 116/93 (BP Location: Left  Arm)    Pulse 88    Temp 98.2 F (36.8 C) (Oral)    Resp 18    Wt 178 lb (80.7 kg)    LMP 04/18/2019 (Exact Date)    SpO2 99%    BMI 32.56 kg/m   Visual Acuity Right Eye Distance:   Left Eye Distance:   Bilateral Distance:    Right Eye Near:   Left Eye Near:    Bilateral Near:     Physical Exam Vitals and nursing note reviewed.  Constitutional:      General: She is not in  acute distress.    Appearance: Normal appearance. She is not ill-appearing.  HENT:     Head: Normocephalic and atraumatic.     Right Ear: Tympanic membrane, ear canal and external ear normal. There is no impacted cerumen.     Left Ear: Tympanic membrane, ear canal and external ear normal. There is no impacted cerumen.     Nose: Congestion and rhinorrhea present.     Mouth/Throat:     Mouth: Mucous membranes are moist.     Pharynx: Oropharynx is clear. Posterior oropharyngeal erythema present.  Cardiovascular:     Rate and Rhythm: Normal rate and regular rhythm.     Pulses: Normal pulses.     Heart sounds: Normal heart sounds. No murmur heard.   No gallop.  Pulmonary:     Effort: Pulmonary effort is normal.     Breath sounds: Normal breath sounds. No wheezing, rhonchi or rales.  Musculoskeletal:     Cervical back: Normal range of motion and neck supple.  Lymphadenopathy:     Cervical: Cervical adenopathy present.  Skin:    General: Skin is warm and dry.     Capillary Refill: Capillary refill takes less than 2 seconds.     Findings: No erythema or rash.  Neurological:     General: No focal deficit present.     Mental Status: She is alert and oriented to person, place, and time.  Psychiatric:        Mood and Affect: Mood normal.        Behavior: Behavior normal.        Thought Content: Thought content normal.        Judgment: Judgment normal.     UC Treatments / Results  Labs (all labs ordered are listed, but only abnormal results are displayed) Labs Reviewed - No data to  display  EKG   Radiology No results found.  Procedures Procedures (including critical care time)  Medications Ordered in UC Medications - No data to display  Initial Impression / Assessment and Plan / UC Course  I have reviewed the triage vital signs and the nursing notes.  Pertinent labs & imaging results that were available during my care of the patient were reviewed by me and considered in my medical decision making (see chart for details).  Patient is a nontoxic-appearing 50 year old female here for evaluation of respiratory symptoms as outlined in the HPI above.  Patient tested positive for COVID at home today as well.  On physical exam she has pearly-gray tympanic membranes bilaterally with normal light reflex and clear external auditory canals.  Nasal mucosa is erythematous and edematous with clear nasal discharge in both nares.  The discharge is scant in amount.  Oropharyngeal exam reveals posterior oropharyngeal erythema and injection with clear postnasal drip.  Patient does have bilateral anterior cervical lymphadenopathy on exam.  Cardiopulmonary exam reveals clear lung sounds in all fields.  Patient exam is consistent with viral upper respiratory infection with her testing positive for COVID at home I will treat her with molnupiravir as we do not have any recent blood work in our system.  We will also give Atrovent nasal spray to up the nasal congestion, Tessalon Perles and Promethazine DM at cough syrup to help with cough and congestion.  Patient vies use Tylenol and ibuprofen according to package instructions as needed for fever and body aches.  Patient was requesting prednisone and I told her it was contraindicated given the fact that she has COVID and it  can make her symptoms worse as it suppresses the immune system.  Patient strict return for new or worsening symptoms.  I have also advised patient that she will need to quarantine for 5 days from the onset of symptoms which will  put her back to work on Wednesday.  Work note provided.   Final Clinical Impressions(s) / UC Diagnoses   Final diagnoses:  [id number]     Discharge Instructions      You will have to quarantine for 5 days from the start of your symptoms.  After 5 days you can break quarantine if your symptoms have improved and you have not had a fever for 24 hours without taking Tylenol or ibuprofen.  Use over-the-counter Tylenol and ibuprofen as needed for body aches and fever.  Use the Tessalon Perles during the day as needed for cough and the Promethazine DM cough syrup at nighttime as will make you drowsy.  Take the molnupiravir twice daily for 5 days for treatment of COVID-19.  If you develop any increased shortness of breath-especially at rest, you are unable to speak in full sentences, or is a late sign your lips are turning blue you need to go the ER for evaluation.      ED Prescriptions     Medication Sig Dispense Auth. Provider   molnupiravir EUA (LAGEVRIO) 200 mg CAPS capsule Take 4 capsules (800 mg total) by mouth 2 (two) times daily for 5 days. 40 capsule Margarette Canada, NP   benzonatate (TESSALON) 100 MG capsule Take 2 capsules (200 mg total) by mouth every 8 (eight) hours. 21 capsule Margarette Canada, NP   ipratropium (ATROVENT) 0.06 % nasal spray Place 2 sprays into both nostrils 4 (four) times daily. 15 mL Margarette Canada, NP   promethazine-dextromethorphan (PROMETHAZINE-DM) 6.25-15 MG/5ML syrup Take 5 mLs by mouth 4 (four) times daily as needed. 118 mL Margarette Canada, NP      PDMP not reviewed this encounter.   Margarette Canada, NP 02/02/21 1719

## 2021-02-02 NOTE — Discharge Instructions (Addendum)
You will have to quarantine for 5 days from the start of your symptoms.  After 5 days you can break quarantine if your symptoms have improved and you have not had a fever for 24 hours without taking Tylenol or ibuprofen.  Use over-the-counter Tylenol and ibuprofen as needed for body aches and fever.  Use the Tessalon Perles during the day as needed for cough and the Promethazine DM cough syrup at nighttime as will make you drowsy.  Take the molnupiravir twice daily for 5 days for treatment of COVID-19.  If you develop any increased shortness of breath-especially at rest, you are unable to speak in full sentences, or is a late sign your lips are turning blue you need to go the ER for evaluation.

## 2021-02-02 NOTE — ED Triage Notes (Signed)
Patient is here today for "Thursday about mid afternoon, evening started with being excessively tired, body aches". Concerned with "Flu". Chills. No fever known. Stuffy/runny nose. Some cough. No sob. Some s/t. Diarreha "1x, recently". Nausea "about 24 hrs" up to 1 vomiting episode, none since, in late afternoon, yesterday. No flu vaccine. @ home COVID19 test +.

## 2021-05-18 IMAGING — MR MR 3D RECON AT SCANNER
19 series · 19 of 19 positions shown · IV contrast (gadavist)
Comparison: Abdominal ultrasound dated 08/10/2019

CLINICAL DATA: Follow-up renal lesion on ultrasound

EXAM:
MRI ABDOMEN WITHOUT AND WITH CONTRAST (INCLUDING MRCP)
TECHNIQUE: Multiplanar multisequence MR imaging of the abdomen was performed
both before and after the administration of intravenous contrast.
Heavily T2-weighted images of the biliary and pancreatic ducts were
obtained, and three-dimensional MRCP images were rendered by post
processing.
CONTRAST:  8mL GADAVIST GADOBUTROL 1 MMOL/ML IV SOLN

[Series 3: T2 · coronal · 6.0mm · 1.19mm/px · 1 of 30 slices shown (1 of 2)]
[im 1/30]
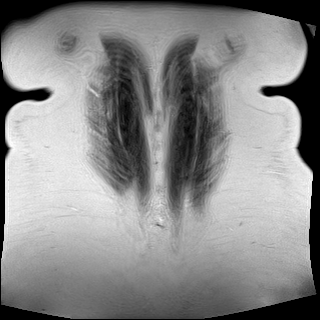

[Series 4: T2 · axial · 6.5mm · 1.19mm/px · 1 of 33 slices shown (2 of 2)]
[im 1/33]
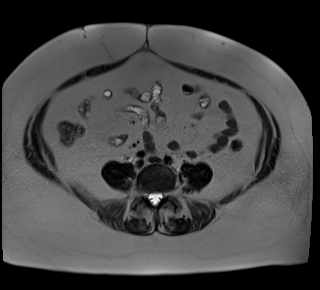

[Series 5: T1 · axial · 6.5mm · 0.74mm/px · 1 of 33 slices shown]
[im 1/33]
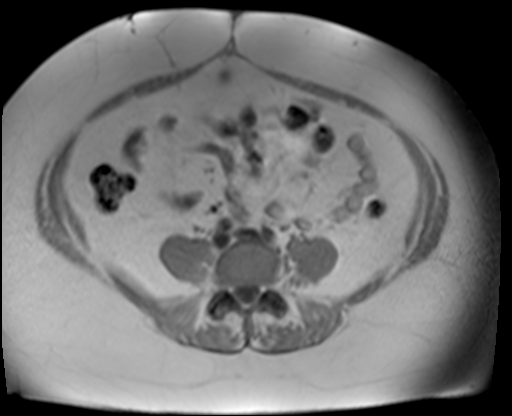

[Series 8: T2 fat-sat · axial · 6.5mm · 1.19mm/px · 1 of 33 slices shown]
[im 1/33]
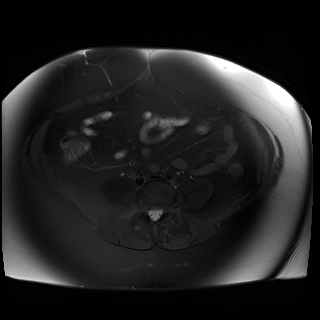

[Series 9: ax dwi_tracew · axial · 6.5mm · 1.42mm/px · 1 of 102 slices shown]
[im 1/102]
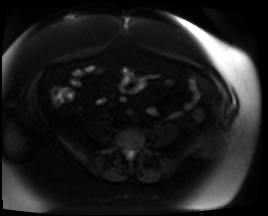

[Series 10: ax dwi_adc · axial · 6.5mm · 1.42mm/px · 1 of 34 slices shown]
[im 1/34]
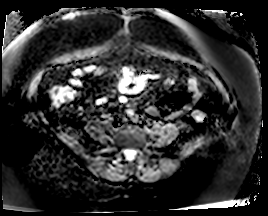

[Series 11: MRCP · coronal · 1.0mm · 0.49mm/px · 1 of 72 slices shown (1 of 2)]
[im 1/72]
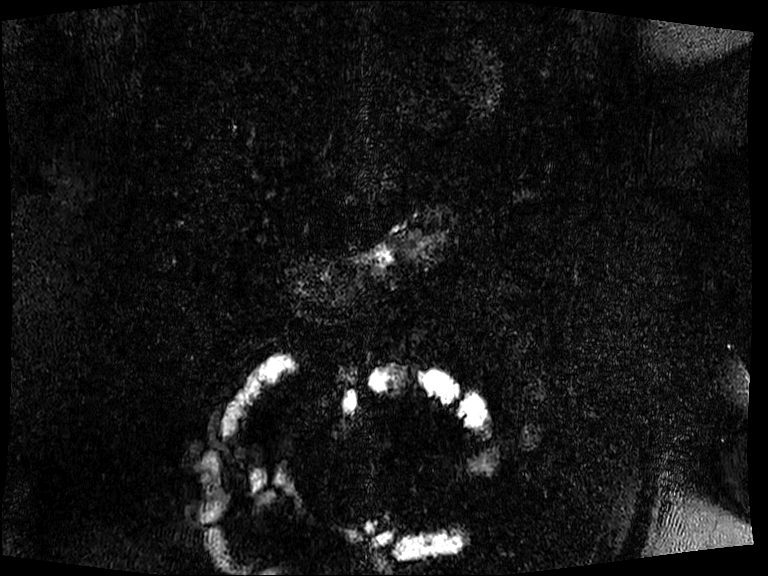

[Series 13: MRCP · coronal · 3.0mm · 1.12mm/px · 1 of 17 slices shown (2 of 2)]
[im 1/17]
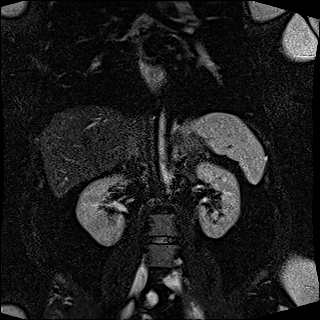

[Series 15: T1 dynamic fat-sat · axial · non-contrast · 3.0mm · 1.19mm/px · 1 of 80 slices shown (1 of 5)]
[im 1/80]
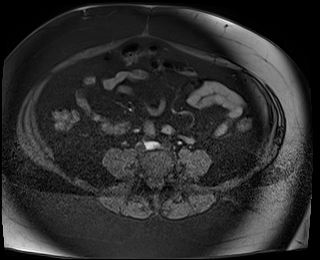

[Series 16: T1 dynamic fat-sat post-contrast · axial · 3.0mm · 1.19mm/px · 1 of 80 slices shown (1 of 4)]
[im 1/80]
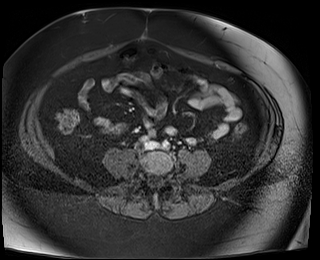

[Series 17: T1 dynamic fat-sat · axial · 3.0mm · 1.19mm/px · 1 of 80 slices shown (2 of 5)]
[im 1/80]
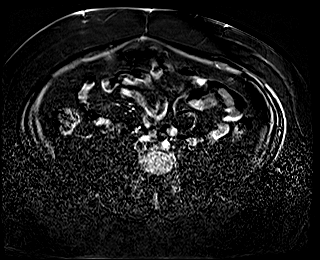

[Series 18: T1 dynamic fat-sat post-contrast · axial · 3.0mm · 1.19mm/px · 1 of 80 slices shown (2 of 4)]
[im 1/80]
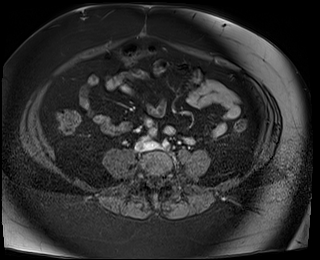

[Series 19: T1 dynamic fat-sat · axial · 3.0mm · 1.19mm/px · 1 of 80 slices shown (3 of 5)]
[im 1/80]
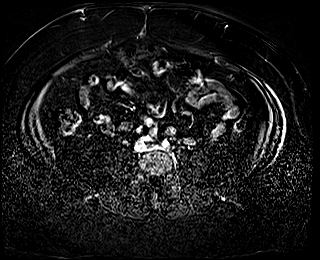

[Series 20: T1 dynamic fat-sat post-contrast · axial · 3.0mm · 1.19mm/px · 1 of 80 slices shown (3 of 4)]
[im 1/80]
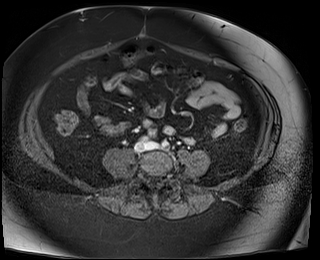

[Series 21: T1 dynamic fat-sat · axial · 3.0mm · 1.19mm/px · 1 of 80 slices shown (4 of 5)]
[im 1/80]
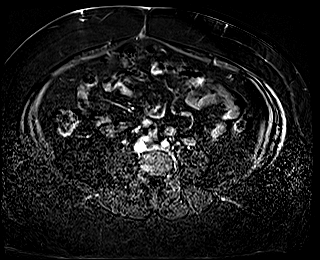

[Series 22: T1 dynamic post-contrast · coronal · 3.0mm · 1.31mm/px · 1 of 72 slices shown]
[im 1/72]
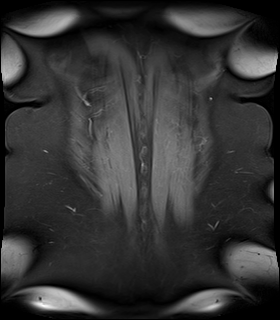

[Series 23: T1 dynamic fat-sat post-contrast · axial · 3.0mm · 1.19mm/px · 1 of 80 slices shown (4 of 4)]
[im 1/80]
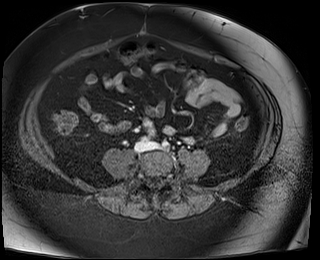

[Series 24: T1 dynamic fat-sat · axial · 3.0mm · 1.19mm/px · 1 of 80 slices shown (5 of 5)]
[im 1/80]
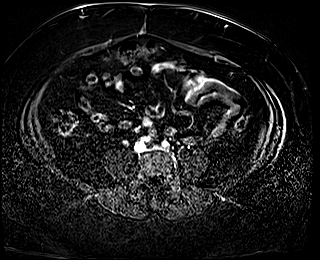

[Series 1014: out of phase · axial · 6.5mm · 0.74mm/px · 1 of 33 slices shown]
[im 1/33]
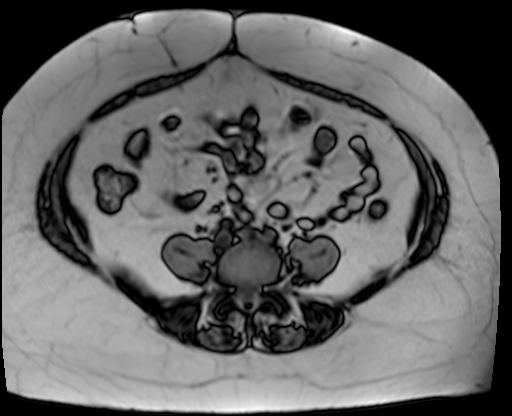

[19 of 19 positions shown; findings below may reference images not displayed]

FINDINGS: Lower chest: Lung bases are clear.

Hepatobiliary: No morphologic findings of cirrhosis. Mild hepatic
steatosis. No suspicious/enhancing hepatic lesions.

Status post cholecystectomy. No intrahepatic or extrahepatic ductal
dilatation. Common duct measures 5 mm. No choledocholithiasis is
seen.

Pancreas:  Within normal limits.

Spleen:  Within normal limits.

Adrenals/Urinary Tract:  Adrenal glands are within normal limits.

2.7 cm simple medial right upper pole renal cyst (series 8/image
16), benign (Bosniak I). Left kidney is within normal limits. No
hydronephrosis.

Stomach/Bowel: Stomach is notable for a small hiatal hernia.

Visualized bowel is unremarkable.

Vascular/Lymphatic:  No evidence of abdominal aortic aneurysm.

No suspicious abdominal lymphadenopathy.

Other:  No abdominal ascites.

Musculoskeletal: No focal osseous lesions.
IMPRESSION: 2.7 cm medial right upper pole renal cyst, benign (Bosniak I).

Mild hepatic steatosis.

Status post cholecystectomy. No intrahepatic or extrahepatic ductal
dilatation. Common duct measures 5 mm. No choledocholithiasis is
seen.

## 2021-06-13 ENCOUNTER — Other Ambulatory Visit: Payer: Self-pay | Admitting: Obstetrics and Gynecology

## 2021-06-13 DIAGNOSIS — Z1231 Encounter for screening mammogram for malignant neoplasm of breast: Secondary | ICD-10-CM

## 2021-07-16 ENCOUNTER — Ambulatory Visit
Admission: RE | Admit: 2021-07-16 | Discharge: 2021-07-16 | Disposition: A | Discharge status: Home / Self Care | Payer: 59 | Source: Ambulatory Visit | Attending: Obstetrics and Gynecology | Admitting: Obstetrics and Gynecology

## 2021-07-16 DIAGNOSIS — Z1231 Encounter for screening mammogram for malignant neoplasm of breast: Secondary | ICD-10-CM | POA: Insufficient documentation
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
## 2021-12-10 ENCOUNTER — Ambulatory Visit (INDEPENDENT_AMBULATORY_CARE_PROVIDER_SITE_OTHER): Payer: 59 | Admitting: Psychiatry

## 2021-12-10 ENCOUNTER — Encounter: Payer: Self-pay | Admitting: Psychiatry

## 2021-12-10 VITALS — BP 111/83 | HR 93 | Temp 97.7°F | Ht 62.0 in | Wt 168.4 lb

## 2021-12-10 DIAGNOSIS — F410 Panic disorder [episodic paroxysmal anxiety] without agoraphobia: Secondary | ICD-10-CM | POA: Diagnosis not present

## 2021-12-10 DIAGNOSIS — F331 Major depressive disorder, recurrent, moderate: Secondary | ICD-10-CM

## 2021-12-10 DIAGNOSIS — F4310 Post-traumatic stress disorder, unspecified: Secondary | ICD-10-CM | POA: Diagnosis not present

## 2021-12-10 DIAGNOSIS — R102 Pelvic and perineal pain: Secondary | ICD-10-CM | POA: Insufficient documentation

## 2021-12-10 DIAGNOSIS — N941 Unspecified dyspareunia: Secondary | ICD-10-CM | POA: Insufficient documentation

## 2021-12-10 DIAGNOSIS — N83201 Unspecified ovarian cyst, right side: Secondary | ICD-10-CM | POA: Insufficient documentation

## 2021-12-10 MED ORDER — HYDROXYZINE HCL 25 MG PO TABS: 25.0000 mg | ORAL_TABLET | Freq: Two times a day (BID) | ORAL | 1 refills | Status: DC | PRN

## 2021-12-10 MED ORDER — VENLAFAXINE HCL ER 150 MG PO CP24: 150.0000 mg | ORAL_CAPSULE | Freq: Every day | ORAL | 1 refills | Status: DC

## 2021-12-10 NOTE — Patient Instructions (Signed)
Increase venlafaxine 150 mg daily  Start hydroxyzine 25 mg daily as needed for anxiety  Decrease xanax 0.5 mg daily as needed for anxiety for one week, then discontinue  Next appointment: 12/21 at 8 AM, in person

## 2022-01-28 ENCOUNTER — Telehealth: Payer: Self-pay | Admitting: Psychiatry

## 2022-01-28 NOTE — Telephone Encounter (Signed)
Patient has appointment on 02-06-22 but has called stating the medication Hydroxyzine 25 mg  does not seem to be working. She is taking 2 pills 2x a day as needed and not at the same time. By the time she takes it is not helping and when she takes makes her sleepy. She will also be out of the medication. She does not know what else to do.

## 2022-01-29 NOTE — Telephone Encounter (Signed)
I would advise to stop hydroxyzine at this time if it is not working, or if it causes drowsiness. I would not recommend other medication at this time given those can be habit forming. I would like her to stay at the current dose of venlafaxine until the next visit, and will discuss/assess more.

## 2022-01-30 ENCOUNTER — Other Ambulatory Visit: Payer: Self-pay | Admitting: Psychiatry

## 2022-01-30 MED ORDER — VENLAFAXINE HCL ER 37.5 MG PO CP24: 37.5000 mg | ORAL_CAPSULE | Freq: Every day | ORAL | 0 refills | Status: DC

## 2022-01-30 NOTE — Telephone Encounter (Signed)
pt asked if she can take xanax that she has left over. she states she needs something to help her calm down

## 2022-01-30 NOTE — Telephone Encounter (Signed)
Discussed with the patient.  She states that she continues to feel anxious, although higher dose of venlafaxine has been helpful for depression.  She denies SI.  She verbalized understanding of not to take Xanax due to concern of tolerance and dependence.  She agrees with the following.  -Increase venlafaxine 187.5 mg daily  - keep the appointment next week
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
## 2022-02-06 ENCOUNTER — Ambulatory Visit (INDEPENDENT_AMBULATORY_CARE_PROVIDER_SITE_OTHER): Payer: 59 | Admitting: Psychiatry

## 2022-02-06 ENCOUNTER — Encounter: Payer: Self-pay | Admitting: Psychiatry

## 2022-02-06 VITALS — BP 121/80 | HR 102 | Temp 97.9°F | Ht 62.0 in | Wt 169.2 lb

## 2022-02-06 DIAGNOSIS — F1021 Alcohol dependence, in remission: Secondary | ICD-10-CM

## 2022-02-06 DIAGNOSIS — G4700 Insomnia, unspecified: Secondary | ICD-10-CM | POA: Diagnosis not present

## 2022-02-06 DIAGNOSIS — F331 Major depressive disorder, recurrent, moderate: Secondary | ICD-10-CM | POA: Diagnosis not present

## 2022-02-06 DIAGNOSIS — F4310 Post-traumatic stress disorder, unspecified: Secondary | ICD-10-CM | POA: Diagnosis not present

## 2022-02-06 MED ORDER — TRAZODONE HCL 50 MG PO TABS: 25.0000 mg | ORAL_TABLET | Freq: Every evening | ORAL | 1 refills | Status: AC | PRN

## 2022-02-06 MED ORDER — VENLAFAXINE HCL ER 150 MG PO CP24: 150.0000 mg | ORAL_CAPSULE | Freq: Every day | ORAL | 1 refills | Status: DC

## 2022-02-06 MED ORDER — VENLAFAXINE HCL ER 75 MG PO CP24: 75.0000 mg | ORAL_CAPSULE | Freq: Every day | ORAL | 1 refills | Status: AC

## 2022-02-25 ENCOUNTER — Telehealth: Payer: Self-pay

## 2022-02-25 NOTE — Telephone Encounter (Signed)
pt left a message that you had increaed her medication but it is causing her to have headaches so she wanted to decrease backl

## 2022-02-25 NOTE — Telephone Encounter (Signed)
Advise her to reduce venlafaxine to 187.5 mg daily. Please let me know if she needs a refill.

## 2022-02-26 ENCOUNTER — Other Ambulatory Visit: Payer: Self-pay | Admitting: Psychiatry

## 2022-02-26 NOTE — Telephone Encounter (Signed)
Venlafaxine 37.5 mg was ordered this morning, so she should have enough meds to make 187.5 mg daily.  Anxiety-  I would recommend against going back on Xanax. If she is interested, she can try buspar for anxiety (this is scheduled medication, and not as needed).

## 2022-02-26 NOTE — Telephone Encounter (Signed)
notified patient . she states she only have the '150mg'$  and the 37.5 mg so she either need a lower dosage or something sent into the pharmacy. she also states that she is having anxiety so bad. she asked if she can go back on the xanax. she states that the medication you have her on is not working.

## 2022-02-27 ENCOUNTER — Other Ambulatory Visit: Payer: Self-pay | Admitting: Psychiatry

## 2022-02-27 MED ORDER — BUSPIRONE HCL 5 MG PO TABS: 5.0000 mg | ORAL_TABLET | Freq: Two times a day (BID) | ORAL | 0 refills | Status: AC

## 2022-02-27 NOTE — Telephone Encounter (Signed)
Discussed with the patient.  She states that she has headache, nausea, itchiness lately.  She has been taking venlafaxine 225 mg daily until a few days ago. She now takes 187.5 mg daily for the past few days by accident.  She has panic attacks, and would like to be back on Xanax. She verbalized understanding of the plan below.  - decrease venlafaxine 150 mg daily  - start buspar 5 mg twice a day after her current symptoms resolve - sooner follow up on 1/26 at 8am, video

## 2022-02-27 NOTE — Telephone Encounter (Signed)
she states she has about a week worth of the 37.'5mg'$  and she she states she can not take the buspar because she does drink. she states she prefer to speak with you directly she just don't understand all of these mediation changes and why she can not have a few xanax.
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
## 2022-03-14 ENCOUNTER — Telehealth (INDEPENDENT_AMBULATORY_CARE_PROVIDER_SITE_OTHER): Payer: 59 | Admitting: Psychiatry

## 2022-03-14 ENCOUNTER — Encounter: Payer: Self-pay | Admitting: Psychiatry

## 2022-03-14 DIAGNOSIS — F410 Panic disorder [episodic paroxysmal anxiety] without agoraphobia: Secondary | ICD-10-CM | POA: Diagnosis not present

## 2022-03-14 DIAGNOSIS — F4310 Post-traumatic stress disorder, unspecified: Secondary | ICD-10-CM | POA: Diagnosis not present

## 2022-03-14 DIAGNOSIS — F331 Major depressive disorder, recurrent, moderate: Secondary | ICD-10-CM

## 2022-03-14 MED ORDER — VENLAFAXINE HCL ER 37.5 MG PO CP24: 112.5000 mg | ORAL_CAPSULE | Freq: Every day | ORAL | 1 refills | Status: AC

## 2022-03-14 NOTE — Patient Instructions (Addendum)
Decrease venlafaxine 112.5 mg daily  Hold Trazodone Next appointment: N/A. Please follow up by your primary care provider as discussed.

## 2022-04-01 ENCOUNTER — Ambulatory Visit: Payer: 59 | Admitting: Psychiatry

## 2022-05-14 ENCOUNTER — Ambulatory Visit (INDEPENDENT_AMBULATORY_CARE_PROVIDER_SITE_OTHER): Payer: 59 | Admitting: Licensed Clinical Social Worker

## 2022-05-14 DIAGNOSIS — F410 Panic disorder [episodic paroxysmal anxiety] without agoraphobia: Secondary | ICD-10-CM | POA: Diagnosis not present

## 2022-05-14 DIAGNOSIS — F331 Major depressive disorder, recurrent, moderate: Secondary | ICD-10-CM

## 2022-05-14 DIAGNOSIS — F4310 Post-traumatic stress disorder, unspecified: Secondary | ICD-10-CM

## 2022-05-19 NOTE — Progress Notes (Signed)
Comprehensive Clinical Assessment (CCA) Note  05/14/2022 Loretta Dalton [id number]  Chief Complaint:  Chief Complaint  Patient presents with   Anxiety   Trauma   Stress   Depression   Establish Care   Visit Diagnosis:  Encounter Diagnoses  Name Primary?   PTSD (post-traumatic stress disorder) Yes   MDD (major depressive disorder), recurrent episode, moderate (HCC)    Panic attack     Pt presents to [place] as a 52yo Caucasian female endorsing mixed sxs of MDD and PTSD including but not limited to flashbacks, sleep disturbances, lack of motivation, lack of interest, depressed mood, negative self affect, and a hx of trauma dating back to childhood that has continued into adulthood. Pt oriented to person, place, and time. Pt denies SI/HI or A/V hallucinations. Pt was cooperative during visit and was engaged throughout the visit. Pt does not report any other concerns at the time of visit.  LCSW answered any questions that the pt had about the treatment plan and used motivational interviewing techniques to complete the CCA and treatment plan with the pt. LCSW showed unconditional positive regard and validated the pts thoughts and feelings.   Pt contributed to their treatment plan during session and was active in the process of establishing treatment goals. Pt agreed to digital signature of their treatment plan in session.   Pt reported that she has engaged in multiple forms of therapy prior to today's appt, the most recent therapeutic experience being couples therapy for her and her husband after her husband cheated on her, which pt reports feeling was helpful to their marriage. Pt reported hx of trying EMDR and other forms of trauma informed therapy.   Pt reported that alcoholism and mental health struggles do run in her family.   Pt identified her support system to be her support groups, her neighbor Augustin Coupe), her sister Selinda Eon), and her husband.   Pt identified her coping strategies to  be going to support groups, going to therapy, taking time for self, and sleeping. Pt identified goal to improve coping strategies.   Pt identified goals for therapy to be wanting to work on acceptance of self and husband, have a space to process trauma, have a space to better self physically and mentally, and have a space to work towards improving marriage.   Pt identified other current stressors to be her relationship with her daughter. Pt shared that they have values differences resulting in increased conflict. Pt also reported stress due to state of the world.   Pt identified problems to be her anxious and depressive sxs interfering with ADLs.   Provided pt education re: acceptance. Discussed how to make acceptance accessible at all parts of pt's healing journey.   Approached pt with strengths based perspective to assist pt in exploring strengths in moments of feeling low.   LCSW practiced active listening to validate pt participation, build rapport, and create safe space for pt to feel heard as they are disclosing their thoughts and feelings.   LCSW utilized therapeutic conversation skills informed by CBT, DBT, and ACT to expose pt to multiple ways of thinking about healing and to provide pt to access to multiple interventions.  LCSW introduced pt to Acceptance and Commitment Therapy. Pt engaged in discussion on how to explore what they must accept in order to commit to what they have identified as important. Introduced pt to values directed goal exploration in order to identify goals of importance.   Introduced pt to Dialectical Behavior Therapy and  the importance of acceptance and change. Discussed concept of radical acceptance and also assisted pt in learning barriers to engaging in radical acceptance in journey towards change. Discussed importance of leaning into the dialectic and taught pt "and also" statements to assist in engaging with the bothness of change.   Continued  Recommendations as followed: Self-care behaviors, positive social engagements, focusing on positive physical and emotional wellness, and focusing on life/work balance.   CCA Screening, Triage and Referral (STR)  Patient Reported Information How did you hear about [place]? Primary Care  Referral name: Boykin Reaper  Referral phone number: No data recorded  Whom do you see for routine medical problems? Primary Care  Practice/Facility Name: [facility]  Practice/Facility Phone Number: No data recorded Name of Contact: No data recorded Contact Number: No data recorded Contact Fax Number: No data recorded Prescriber Name: Boykin Reaper  Prescriber Address (if known): No data recorded  What Is the Reason for Your Visit/Call Today? establishing care for tx of depression and anxiety  How Long Has This Been Causing You Problems? > than 6 months  What Do You Feel Would Help You the Most Today? Treatment for Depression or other mood problem   Have You Recently Been in Any Inpatient Treatment (Hospital/Detox/Crisis Center/28-Day Program)? No  Name/Location of Program/Hospital:No data recorded How Long Were You There? No data recorded When Were You Discharged? No data recorded  Have You Ever Received Services From [facility] Before? Yes  Who Do You See at [facility]? Mimi McLaughlin   Have You Recently Had Any Thoughts About Hurting Yourself? No  Are You Planning to Commit Suicide/Harm Yourself At This time? No   Have you Recently Had Thoughts About [place]? No  Explanation: No data recorded  Have You Used Any Alcohol or Drugs in the Past 24 Hours? No  How Long Ago Did You Use Drugs or Alcohol? No data recorded What Did You Use and How Much? No data recorded  Do You Currently Have a Therapist/Psychiatrist? Yes  Name of Therapist/Psychiatrist: Therapist: Alvy Beal, LCSW   Have You Been Recently Discharged From Any Office Practice or Programs?  No  Explanation of Discharge From Practice/Program: No data recorded    CCA Screening Triage Referral Assessment Type of Contact: Face-to-Face  Is this Initial or Reassessment? No data recorded Date Telepsych consult ordered in [facility]:  No data recorded Time Telepsych consult ordered in [facility]:  No data recorded  Patient Reported Information Reviewed? No data recorded Patient Left Without Being Seen? No data recorded Reason for Not Completing Assessment: No data recorded  Collateral Involvement: None   Does Patient Have a Court Appointed Legal Guardian? No data recorded Name and Contact of Legal Guardian: No data recorded If Minor and Not Living with Parent(s), Who has Custody? N/A  Is CPS involved or ever been involved? Never  Is APS involved or ever been involved? Never   Patient Determined To Be At Risk for Harm To Self or Others Based on Review of Patient Reported Information or Presenting Complaint? No  Method: No Plan  Availability of Means: No access or NA  Intent: Vague intent or NA  Notification Required: No need or identified person  Additional Information for Danger to Others Potential: No data recorded Additional Comments for Danger to Others Potential: Pt denies SI/HI  Are There Guns or Other Weapons in Your Home? -- (Pt reported desire to opt out of answering)  Types of Guns/Weapons: See comment  Are These Weapons  Safely Secured?                            -- (Pt preferred to opt out of answering)  Who Could Verify You Are Able To Have These Secured: See comment  Do You Have any Outstanding Charges, Pending Court Dates, Parole/Probation? None  Contacted To Inform of Risk of Harm To Self or Others: No data recorded  Location of Assessment: Other (comment) ([facility])   Does Patient Present under Involuntary Commitment? No  IVC Papers Initial File Date: No data recorded  [place] of Residence: [place]   Patient Currently Receiving the Following Services:  No data recorded  Determination of Need: No data recorded  Options For Referral: No data recorded    CCA Biopsychosocial Intake/Chief Complaint:  Pt presents with sxs of PTSD and MDD due to hx of childhood challenges, grief, and experiencing difficulties in life currently.  Current Symptoms/Problems: Flashbacks, avoidance of reminders of trauma, fatigue, negative self affect, depressed mood, occasional sleep difficulties, lack of motivation, lack of interest   Patient Reported Schizophrenia/Schizoaffective Diagnosis in Past: No   Strengths: Empathetic, good listener, good with people, good comprehension skills  Preferences: prefer days off vs days at work, prefer working from home, prefer being outside  [employer]: good mom/good at parenting, good comprehension skills, good caregiver, good reader   Type of Services Patient Feels are Needed: outpatient therapy   Initial Clinical Notes/Concerns: No data recorded  Mental Health Symptoms Depression:   Change in energy/activity; Difficulty Concentrating; Fatigue; Sleep (too much or little); Increase/decrease in appetite; Weight gain/loss; Irritability; Tearfulness; Worthlessness   Duration of Depressive symptoms:  Greater than two weeks   Mania:   N/A   Anxiety:    Difficulty concentrating; Fatigue; Sleep; Worrying; Tension; Irritability   Psychosis:   None   Duration of Psychotic symptoms: No data recorded  Trauma:   Avoids reminders of event; Difficulty staying/falling asleep; Irritability/anger; Re-experience of traumatic event   Obsessions:   N/A   Compulsions:   N/A   Inattention:   N/A   Hyperactivity/Impulsivity:   N/A   Oppositional/Defiant Behaviors:   N/A   Emotional Irregularity:   N/A   Other Mood/Personality Symptoms:  No data recorded   Mental Status Exam Appearance and self-care  Stature:   Average   Weight:   Average weight   Clothing:   Casual; Age-appropriate   Grooming:    Normal   Cosmetic use:   Age appropriate   Posture/gait:   Normal   Motor activity:   Not Remarkable   Sensorium  Attention:   Normal   Concentration:   Normal   Orientation:   X5   Recall/memory:   Normal   Affect and Mood  Affect:   Appropriate   Mood:   Anxious; Depressed   Relating  Eye contact:   Normal   Facial expression:   Responsive   Attitude toward examiner:   Cooperative   Thought and Language  Speech flow:  Clear and Coherent   Thought content:   Appropriate to Mood and Circumstances   Preoccupation:   None   Hallucinations:   None   Organization:  No data recorded  [employer] of Knowledge:   Average   Intelligence:   Average   Abstraction:   Normal   Judgement:   Fair   Reality Testing:   Adequate; Realistic   Insight:   Fair   Decision Making:  Normal   Social Functioning  Social Maturity:   Responsible   Social Judgement:   Normal   Stress  Stressors:   Family conflict; Grief/losses; Transitions; Relationship   Coping Ability:   Overwhelmed; Resilient   Skill Deficits:   Activities of daily living   Supports:   Friends/Service system; Family     Religion: Religion/Spirituality Are You A Religious Person?: Yes What is Your Religious Affiliation?: Personal assistant: Leisure / Recreation Do You Have Hobbies?: No  Exercise/Diet: Exercise/Diet Do You Exercise?: Yes What Type of Exercise Do You Do?: Other (Comment) (yoga and pilates) How Many Times a Week Do You Exercise?: 4-5 times a week Have You Gained or Lost A Significant Amount of Weight in the Past Six Months?: Yes-Gained Number of Pounds Gained: 10 Do You Follow a Special Diet?: No Do You Have Any Trouble Sleeping?: Yes Explanation of Sleeping Difficulties: Pt reports feeling that she sleeps too much   CCA Employment/Education Employment/Work Situation: Employment / Work Situation Employment  Situation: Employed Where is Patient Currently Employed?: [employer] How Long has Patient Been Employed?: 4 years Are You Satisfied With Your Job?: Yes Do You Work More Than One Job?: No Work Stressors: changes recently What is the Longest Time Patient has Held a Job?: 5 years 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 Has Patient ever Been in the [employer]?: No  Education: Education Is Patient Currently Attending School?: No Last Grade Completed: 12 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 Did Teacher, adult education From [employer]?: Yes Did Physicist, medical?: Yes What Type of College Degree Do you Have?: Media planner- fashion Designer, multimedia associates Did Heritage manager?: No 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, multimedia Did You Have An Individualized Education Program (IIEP): No Did You Have Any Difficulty At [employer]?: No Patient's Education Has Been Impacted by Current Illness: No   CCA Family/Childhood History Family and Relationship History: Family history Marital status: Married Number of Years Married: 3 What types of issues is patient dealing with in the relationship?: Husband has cheated on pt; recently went to marriage counseling and pt feels that has helped Are you sexually active?: Yes What is your sexual orientation?: straight Does patient have children?: Yes How many children?: 1 How is patient's relationship with their children?: Strained relationship due to values differences  Childhood History:  Childhood History By whom was/is the patient raised?: Both parents Additional childhood history information: Pt reported that her dad was an alcoholic Description of patient's relationship with caregiver when they were a child: Pt reported overall having a good relationship with parents Patient's description of current relationship with people who raised him/her: Pt reported that her father is deceased and  her mother she assists in caregiving How were you disciplined when you got in trouble as a child/adolescent?: spanking, talking to's Does patient have siblings?: Yes Number of Siblings: 3 Description of patient's current relationship with siblings: pt reported that she doesn't know her one half sister, and her other half sister and biological sister pt denies relationship Did patient suffer any verbal/emotional/physical/sexual abuse as a child?: No Did patient suffer from severe childhood neglect?: No Has patient ever been sexually abused/assaulted/raped as an adolescent or adult?: No Was the patient ever a victim of a crime or a disaster?: No Witnessed domestic violence?: No Has patient been affected by domestic violence as an adult?: No  Child/Adolescent Assessment:     CCA Substance Use Alcohol/Drug Use:  Alcohol / Drug Use History of alcohol / drug use?: Yes Longest period of sobriety (when/how long): 18 months Negative Consequences of Use: Personal relationships, Financial, Work / School Withdrawal Symptoms: Nausea / Vomiting, Patient aware of relationship between substance abuse and physical/medical complications, Diarrhea Substance #1 Name of Substance 1: Alcohol 1 - Age of First Use: 9 Substance #2 Name of Substance 2: Marijuanna and Cocaine 2 - Age of First Use: 14                     ASAM's:  Six Dimensions of Multidimensional Assessment  Dimension 1:  Acute Intoxication and/or Withdrawal Potential:      Dimension 2:  Biomedical Conditions and Complications:      Dimension 3:  Emotional, Behavioral, or Cognitive Conditions and Complications:     Dimension 4:  Readiness to Change:     Dimension 5:  Relapse, Continued use, or Continued Problem Potential:     Dimension 6:  Recovery/Living Environment:     ASAM Severity Score:    ASAM Recommended Level of Treatment:     Substance use Disorder (SUD)    Recommendations for Services/Supports/Treatments:     DSM5 Diagnoses: Patient Active Problem List   Diagnosis Date Noted   Cyst of right ovary 12/10/2021   Dyspareunia, female 12/10/2021   Pelvic pain in female 12/10/2021   Costochondritis 12/17/2015   Dyspnea 04/21/2011   Urticaria, chronic 04/21/2011   Anxiety 10/24/2010   GERD without esophagitis 10/24/2010   Neck pain 10/24/2010   Panic disorder 10/24/2010   Psoriasis 10/24/2010      02/06/2022    8:39 AM 12/10/2021    3:27 PM  GAD 7 : Generalized Anxiety Score  Nervous, Anxious, on Edge 3 3  Control/stop worrying 1 3  Worry too much - different things 1 3  Trouble relaxing 1 3  Restless 0 0  Easily annoyed or irritable 0 3  Afraid - awful might happen 0 0  Total GAD 7 Score 6 15  Anxiety Difficulty Somewhat difficult Very difficult       02/06/2022    8:38 AM 12/10/2021    3:26 PM  Depression screen PHQ 2/9  Decreased Interest 2 3  Down, Depressed, Hopeless 2 3  PHQ - 2 Score 4 6  Altered sleeping 2 3  Tired, decreased energy 2 3  Change in appetite 1 2  Feeling bad or failure about yourself  1 3  Trouble concentrating 0 2  Moving slowly or fidgety/restless 0 0  Suicidal thoughts 1 1  PHQ-9 Score 11 20  Difficult doing work/chores Somewhat difficult Very difficult     Patient Centered Plan: Patient is on the following Treatment Plan(s):  Depression and Post Traumatic Stress Disorder   Referrals to Alternative Service(s): Referred to Alternative Service(s):   Place:   Date:   Time:    Referred to Alternative Service(s):   Place:   Date:   Time:    Referred to Alternative Service(s):   Place:   Date:   Time:    Referred to Alternative Service(s):   Place:   Date:   Time:      Collaboration of Care: Psychiatrist AEB Dr. Modesta Messing  Patient/Guardian was advised Release of Information must be obtained prior to any record release in order to collaborate their care with an outside provider. Patient/Guardian was advised if they have not already done so to  contact the registration department to sign all necessary forms in order  for [place] to release information regarding their care.   Consent: Patient/Guardian gives verbal consent for treatment and assignment of benefits for services provided during this visit. Patient/Guardian expressed understanding and agreed to proceed.   Daleen Bo [place], LCSW

## 2022-06-03 ENCOUNTER — Ambulatory Visit (INDEPENDENT_AMBULATORY_CARE_PROVIDER_SITE_OTHER): Payer: 59 | Admitting: Licensed Clinical Social Worker

## 2022-06-03 DIAGNOSIS — F331 Major depressive disorder, recurrent, moderate: Secondary | ICD-10-CM | POA: Diagnosis not present

## 2022-06-03 DIAGNOSIS — F4310 Post-traumatic stress disorder, unspecified: Secondary | ICD-10-CM | POA: Diagnosis not present

## 2022-06-03 NOTE — Progress Notes (Signed)
THERAPIST PROGRESS NOTE  Session Time: 8:01AM-8:50AM  Participation Level: Active  Behavioral Response: CasualAlertAnxious and Depressed  Type of Therapy: Individual Therapy  Treatment Goals addressed:  Recall traumatic events without becoming overwhelmed with negative emotions  Reduce overall frequency, intensity and duration of depression so that daily functioning is not impaired per pt self report 3 out of 5 sessions documented.    ProgressTowards Goals: Not Progressing  Interventions: CBT, DBT, Motivational Interviewing, Strength-based, and Other: ACT  Summary: Loretta Dalton is a 52 y.o. female who presents with mixed sxs of anxiety and depression due to hx of trauma and experience with current stressors. Sxs endorsed including but not limited to hopelessness, worry, difficulty controlling worry, and irritability. Pt oriented to person, place, and time. Pt denies SI/HI or A/V hallucinations. Pt was cooperative during visit and was engaged throughout the visit. Pt does not report any other concerns at the time of visit.  Pt reported that she is practicing yoga to improve physical pains. Pt reported feeling that this is improving her energy.   Pt identified feeling gas lit by her husband due to differing experiences with addiction and feeling that her husband does not try to understand her. Discussed that feeling under supported can put people at risk for relapse. Pt identified support system.   Provided psychoed re: addiction. Discussed how pt is using own experiences with addiction to try to understand husband's experience with addiction. Discussed normalcy of using own experiences to understand others. Pt noticed tendency of projecting previous trauma onto marriage. Discussed that rebuilding trust requires risk.  Provided pt processing space to unpack previous trauma.   LCSW provided mood monitoring and treatment progress review in the context of this episode of treatment.  LCSW reviewed the pt's mood status since last session.   Pt is continuing to apply interventions/techniques learned in session into daily life situations. Pt is currently on track to meet goals utilizing interventions that are discussed in session. Treatment to continue as indicated. Personal growth and progress toward goals noted above.  Continued Recommendations as followed: Self-care behaviors, positive social engagements, focusing on positive physical and emotional wellness, and focusing on life/work balance.     Suicidal/Homicidal: Nowithout intent/plan  Therapist Response:  Provided pt education re: acceptance. Discussed how to make acceptance accessible at all parts of pt's healing journey.   Approached pt with strengths based perspective to assist pt in exploring strengths in moments of feeling low.   LCSW practiced active listening to validate pt participation, build rapport, and create safe space for pt to feel heard as they are disclosing their thoughts and feelings.   LCSW utilized therapeutic conversation skills informed by CBT, DBT, and ACT to expose pt to multiple ways of thinking about healing and to provide pt to access to multiple interventions.   Plan: Return again in 1 weeks.  Diagnosis: PTSD (post-traumatic stress disorder)  MDD (major depressive disorder), recurrent episode, moderate    06/03/2022    8:55 AM 02/06/2022    8:39 AM 12/10/2021    3:27 PM  GAD 7 : Generalized Anxiety Score  Nervous, Anxious, on Edge 3 3 3   Control/stop worrying 1 1 3   Worry too much - different things 1 1 3   Trouble relaxing 1 1 3   Restless 0 0 0  Easily annoyed or irritable 2 0 3  Afraid - awful might happen 0 0 0  Total GAD 7 Score 8 6 15   Anxiety Difficulty Somewhat difficult Somewhat difficult Very  difficult       06/03/2022    8:53 AM 02/06/2022    8:38 AM 12/10/2021    3:26 PM  Depression screen PHQ 2/9  Decreased Interest 0 2 3  Down, Depressed, Hopeless 1 2 3    PHQ - 2 Score 1 4 6   Altered sleeping  2 3  Tired, decreased energy  2 3  Change in appetite  1 2  Feeling bad or failure about yourself   1 3  Trouble concentrating  0 2  Moving slowly or fidgety/restless  0 0  Suicidal thoughts  1 1  PHQ-9 Score  11 20  Difficult doing work/chores  Somewhat difficult Very difficult    Collaboration of Care: Psychiatrist AEB Dr. Vanetta Shawl  Patient/Guardian was advised Release of Information must be obtained prior to any record release in order to collaborate their care with an outside provider. Patient/Guardian was advised if they have not already done so to contact the registration department to sign all necessary forms in order for [place] to release information regarding their care.   Consent: Patient/Guardian gives verbal consent for treatment and assignment of benefits for services provided during this visit. Patient/Guardian expressed understanding and agreed to proceed.   Geoffry Paradise, LCSW 06/03/2022

## 2022-06-10 ENCOUNTER — Telehealth (INDEPENDENT_AMBULATORY_CARE_PROVIDER_SITE_OTHER): Payer: 59 | Admitting: Licensed Clinical Social Worker

## 2022-06-10 DIAGNOSIS — F331 Major depressive disorder, recurrent, moderate: Secondary | ICD-10-CM | POA: Diagnosis not present

## 2022-06-10 DIAGNOSIS — F4310 Post-traumatic stress disorder, unspecified: Secondary | ICD-10-CM

## 2022-06-10 NOTE — Progress Notes (Signed)
THERAPIST PROGRESS NOTE  Session Time: 8:01AM-8:54AM  Participation Level: Active  Behavioral Response: Casual and Well GroomedAlertDepressed  Type of Therapy: Individual Therapy  Treatment Goals addressed:  Reduce overall frequency, intensity and duration of depression so that daily functioning is not impaired per pt self report 3 out of 5 sessions documented.   Reduce overall frequency, intensity and duration of anxiety so that daily functioning is not impaired per pt self report 3 out of 5 sessions.   Recall traumatic events without becoming overwhelmed with negative emotions  Develop and implement effective coping skills to carry out normal responsibilities and participate constructively in relationships as evidenced by self report.    ProgressTowards Goals: Progressing  Interventions: CBT, DBT, Motivational Interviewing, Strength-based, and Other: ACT  Virtual Visit via Video Note  I connected with Loretta Dalton on 06/10/22 at  8:00 AM EDT by a video enabled telemedicine application and verified that I am speaking with the correct person using two identifiers.  Location: Patient: located in pt home Provider: working remotely in [place], [place]   I discussed the limitations of evaluation and management by telemedicine and the availability of in person appointments. The patient expressed understanding and agreed to proceed.  I discussed the assessment and treatment plan with the patient. The patient was provided an opportunity to ask questions and all were answered. The patient agreed with the plan and demonstrated an understanding of the instructions.   The patient was advised to call back or seek an in-person evaluation if the symptoms worsen or if the condition fails to improve as anticipated.  I provided 53 minutes of non-face-to-face time during this encounter.   Geoffry Paradise, LCSW  Summary: Loretta Dalton is a 52 y.o. female who presents with mixed sxs of  anxiety and depression due to a hx of trauma and experiences with current stressors. Sxs endorsed including but not limited to worry, difficulty controlling worry, flashbacks, fatigue, crying spells, irritability, and fear of something bad happening. Pt oriented to person, place, and time. Pt denies SI/HI or A/V hallucinations. Pt was cooperative during visit and was engaged throughout the visit. Pt does not report any other concerns at the time of visit.  Pt reported feeling improvements in mood due to going to beach with her mom and having several rest days. Discussed importance of having baseline days where pt allows mind and body to be without editing self.   Pt reported that prior to trip, pt and her husband had a conflict. Pt utilized therapeutic space to process conflict and observe connections between past and current stressors in relationship. Pt identified that her anxiety is afraid to let her put her guard down re: her husband. Pt explored why she is still in relationship with husband. Pt reported feeling insecure for being in relationship and also wanting to be in relationship. Invited pt to explore what on good and bad days is consistent in motivating her to be in relationship. Invited pt to explore where she feels she is walking on eggshells so those areas can be addressed in therapy going forward.  LCSW provided mood monitoring and treatment progress review in the context of this episode of treatment. LCSW reviewed the pt's mood status since last session.   Pt is continuing to apply interventions/techniques learned in session into daily life situations. Pt is currently on track to meet goals utilizing interventions that are discussed in session. Treatment to continue as indicated. Personal growth and progress toward goals noted above.  Continued Recommendations as followed: Self-care behaviors, positive social engagements, focusing on positive physical and emotional wellness, and focusing on  life/work balance.    Suicidal/Homicidal: Nowithout intent/plan  Therapist Response:  Provided pt education re: acceptance. Discussed how to make acceptance accessible at all parts of pt's healing journey.   Approached pt with strengths based perspective to assist pt in exploring strengths in moments of feeling low.   LCSW practiced active listening to validate pt participation, build rapport, and create safe space for pt to feel heard as they are disclosing their thoughts and feelings.   LCSW utilized therapeutic conversation skills informed by CBT, DBT, and ACT to expose pt to multiple ways of thinking about healing and to provide pt to access to multiple interventions.  Provided psychoed re: trauma responses to discomfort and difficulty trauma has in discerning difference between unsafe and uncomfortable. Trauma assumes that uncomfortable is foreshadowing of being unsafe and may invoke a trauma response emotionally that is not warranted to assist pt through discomfort. Assisted pt in identifying difference between uncomfortable and unsafe while discerning when pt can care for self vs when pt needs assistance from trauma response.    Plan: Return again in 2 weeks.  Diagnosis: PTSD (post-traumatic stress disorder)  MDD (major depressive disorder), recurrent episode, moderate    06/03/2022    8:55 AM 02/06/2022    8:39 AM 12/10/2021    3:27 PM  GAD 7 : Generalized Anxiety Score  Nervous, Anxious, on Edge Control/stop worrying Worry too much - different things Trouble relaxing Restless 0 0 0  Easily annoyed or irritable 2 0 3  Afraid - awful might happen 0 0 0  Total GAD 7 Score Anxiety Difficulty Somewhat difficult Somewhat difficult Very difficult       06/03/2022    8:53 AM 02/06/2022    8:38 AM 12/10/2021    3:26 PM  Depression screen PHQ 2/9  Decreased Interest 0 2 3  Down, Depressed, Hopeless PHQ - 2 Score Altered  sleeping  2 3  Tired, decreased energy  2 3  Change in appetite  1 2  Feeling bad or failure about yourself   1 3  Trouble concentrating  0 2  Moving slowly or fidgety/restless  0 0  Suicidal thoughts  1 1  PHQ-9 Score  11 20  Difficult doing work/chores  Somewhat difficult Very difficult    Collaboration of Care: Psychiatrist AEB Dr. Vanetta Shawl  Patient/Guardian was advised Release of Information must be obtained prior to any record release in order to collaborate their care with an outside provider. Patient/Guardian was advised if they have not already done so to contact the registration department to sign all necessary forms in order for [place] to release information regarding their care.   Consent: Patient/Guardian gives verbal consent for treatment and assignment of benefits for services provided during this visit. Patient/Guardian expressed understanding and agreed to proceed.   Geoffry Paradise, LCSW 06/10/2022

## 2022-06-24 ENCOUNTER — Telehealth (INDEPENDENT_AMBULATORY_CARE_PROVIDER_SITE_OTHER): Payer: 59 | Admitting: Licensed Clinical Social Worker

## 2022-06-24 DIAGNOSIS — F4310 Post-traumatic stress disorder, unspecified: Secondary | ICD-10-CM | POA: Diagnosis not present

## 2022-06-24 DIAGNOSIS — F331 Major depressive disorder, recurrent, moderate: Secondary | ICD-10-CM

## 2022-06-24 NOTE — Progress Notes (Signed)
THERAPIST PROGRESS NOTE  Session Time: 8:05AM-9:00AM  Participation Level: Active  Behavioral Response: Casual, Neat, and Well GroomedAlertAnxious and Depressed  Type of Therapy: Individual Therapy  Treatment Goals addressed:  Develop and implement effective coping skills to carry out normal responsibilities and participate constructively in relationships as evidenced by self report.   Recall traumatic events without becoming overwhelmed with negative emotions  Reduce overall frequency, intensity and duration of depression so that daily functioning is not impaired per pt self report 3 out of 5 sessions documented.   ProgressTowards Goals: Progressing  Interventions: CBT, DBT, Motivational Interviewing, Strength-based, and Other: ACT  Virtual Visit via Video Note  I connected with Loretta Dalton on 06/24/22 at  8:00 AM EDT by a video enabled telemedicine application and verified that I am speaking with the correct person using two identifiers.  Location: Patient: pt home Provider: [facility]   I discussed the limitations of evaluation and management by telemedicine and the availability of in person appointments. The patient expressed understanding and agreed to proceed.  I discussed the assessment and treatment plan with the patient. The patient was provided an opportunity to ask questions and all were answered. The patient agreed with the plan and demonstrated an understanding of the instructions.   The patient was advised to call back or seek an in-person evaluation if the symptoms worsen or if the condition fails to improve as anticipated.  I provided 55 minutes of non-face-to-face time during this encounter.   Geoffry Paradise, LCSW  Summary: Loretta Dalton is a 52 y.o. female who presents with mixed sxs of anxiety and depression due to hx of trauma. Sxs endorsed including but not limited to night terrors, insomnia, fatigue, worry, difficulty controlling worry,  fearfulness, and lack of motivation. Pt oriented to person, place, and time. Pt denies SI/HI or A/V hallucinations. Pt was cooperative during visit and was engaged throughout the visit. Pt does not report any other concerns at the time of visit.  Pt shared that she has been having weird dreams. Explored with pt topics in dreams. Discussed that emotions will make room to be processed at night before pt sleeps in the form of racing thoughts or in her sleep in the form of dreams. Discussed keeping a notepad beside pt bed where pt can acknowledge thoughts without investigating them. Informed pt that writing thoughts down can allow [place] time in the morning to decide if the thought needs to be investigated and assists pt in keeping track of consistent stressors. Discussed how to be a viewer of thoughts and how to separate self from thoughts and emotions.   Pt named that she has a hard time falling asleep. Encouraged pt to create a bedtime routine.   Pt shared that she is not talking to her husband about her thoughts and dreams. Pt named that she trusts who her husband is presently but does not trust who he was in the past. Practiced interpersonal skills to assist pt in communicating with husband.   LCSW provided mood monitoring and treatment progress review in the context of this episode of treatment. LCSW reviewed the pt's mood status since last session.   Pt is continuing to apply interventions/techniques learned in session into daily life situations. Pt is currently on track to meet goals utilizing interventions that are discussed in session. Treatment to continue as indicated. Personal growth and progress toward goals noted above.  Continued Recommendations as followed: Self-care behaviors, positive social engagements, focusing on positive physical and  emotional wellness, and focusing on life/work balance.    Suicidal/Homicidal: Nowithout intent/plan  Therapist Response:  Provided pt education re:  acceptance. Discussed how to make acceptance accessible at all parts of pt's healing journey.   Approached pt with strengths based perspective to assist pt in exploring strengths in moments of feeling low.   LCSW practiced active listening to validate pt participation, build rapport, and create safe space for pt to feel heard as they are disclosing their thoughts and feelings.   LCSW utilized therapeutic conversation skills informed by CBT, DBT, and ACT to expose pt to multiple ways of thinking about healing and to provide pt to access to multiple interventions.  Taught pt communication skills.   Provided psychoed re: trauma responses to discomfort and difficulty trauma has in discerning difference between unsafe and uncomfortable. Trauma assumes that uncomfortable is foreshadowing of being unsafe and may invoke a trauma response emotionally that is not warranted to assist pt through discomfort. Assisted pt in identifying difference between uncomfortable and unsafe while discerning when pt can care for self vs when pt needs assistance from trauma response.   Introduced pt to emotion regulation work and provided psychoed re: role of emotions and how to develop working relationship with emotions. Shared with patient that getting rid of emotions may be dangerous as they do serve a purpose for pt. Discussed importance of working alongside emotions as opposed to being controlled by emotions.    Plan: Return again in 1 weeks.  Diagnosis: PTSD (post-traumatic stress disorder)  MDD (major depressive disorder), recurrent episode, moderate (HCC)    06/03/2022    8:55 AM 02/06/2022    8:39 AM 12/10/2021    3:27 PM  GAD 7 : Generalized Anxiety Score  Nervous, Anxious, on Edge 3 3 3   Control/stop worrying 1 1 3   Worry too much - different things 1 1 3   Trouble relaxing 1 1 3   Restless 0 0 0  Easily annoyed or irritable 2 0 3  Afraid - awful might happen 0 0 0  Total GAD 7 Score 8 6 15   Anxiety  Difficulty Somewhat difficult Somewhat difficult Very difficult       06/03/2022    8:53 AM 02/06/2022    8:38 AM 12/10/2021    3:26 PM  Depression screen PHQ 2/9  Decreased Interest 0 2 3  Down, Depressed, Hopeless 1 2 3   PHQ - 2 Score 1 4 6   Altered sleeping  2 3  Tired, decreased energy  2 3  Change in appetite  1 2  Feeling bad or failure about yourself   1 3  Trouble concentrating  0 2  Moving slowly or fidgety/restless  0 0  Suicidal thoughts  1 1  PHQ-9 Score  11 20  Difficult doing work/chores  Somewhat difficult Very difficult    Collaboration of Care: Other N/A  Patient/Guardian was advised Release of Information must be obtained prior to any record release in order to collaborate their care with an outside provider. Patient/Guardian was advised if they have not already done so to contact the registration department to sign all necessary forms in order for [place] to release information regarding their care.   Consent: Patient/Guardian gives verbal consent for treatment and assignment of benefits for services provided during this visit. Patient/Guardian expressed understanding and agreed to proceed.   Geoffry Paradise, LCSW 06/24/2022

## 2022-07-01 ENCOUNTER — Ambulatory Visit (INDEPENDENT_AMBULATORY_CARE_PROVIDER_SITE_OTHER): Payer: 59 | Admitting: Licensed Clinical Social Worker

## 2022-07-01 DIAGNOSIS — F331 Major depressive disorder, recurrent, moderate: Secondary | ICD-10-CM | POA: Diagnosis not present

## 2022-07-01 DIAGNOSIS — F4310 Post-traumatic stress disorder, unspecified: Secondary | ICD-10-CM | POA: Diagnosis not present

## 2022-07-08 ENCOUNTER — Ambulatory Visit (INDEPENDENT_AMBULATORY_CARE_PROVIDER_SITE_OTHER): Payer: 59 | Admitting: Licensed Clinical Social Worker

## 2022-07-08 DIAGNOSIS — F331 Major depressive disorder, recurrent, moderate: Secondary | ICD-10-CM

## 2022-07-08 DIAGNOSIS — F4310 Post-traumatic stress disorder, unspecified: Secondary | ICD-10-CM

## 2022-07-29 ENCOUNTER — Ambulatory Visit (INDEPENDENT_AMBULATORY_CARE_PROVIDER_SITE_OTHER): Payer: 59 | Admitting: Licensed Clinical Social Worker

## 2022-07-29 DIAGNOSIS — F4310 Post-traumatic stress disorder, unspecified: Secondary | ICD-10-CM | POA: Diagnosis not present

## 2022-07-29 DIAGNOSIS — F331 Major depressive disorder, recurrent, moderate: Secondary | ICD-10-CM

## 2022-08-05 ENCOUNTER — Ambulatory Visit: Payer: 59 | Admitting: Licensed Clinical Social Worker

## 2022-08-15 ENCOUNTER — Ambulatory Visit (INDEPENDENT_AMBULATORY_CARE_PROVIDER_SITE_OTHER): Payer: 59 | Admitting: Licensed Clinical Social Worker

## 2022-08-15 DIAGNOSIS — F4310 Post-traumatic stress disorder, unspecified: Secondary | ICD-10-CM | POA: Diagnosis not present

## 2022-08-15 DIAGNOSIS — F331 Major depressive disorder, recurrent, moderate: Secondary | ICD-10-CM | POA: Diagnosis not present

## 2022-08-25 ENCOUNTER — Ambulatory Visit (INDEPENDENT_AMBULATORY_CARE_PROVIDER_SITE_OTHER): Payer: 59 | Admitting: Licensed Clinical Social Worker

## 2022-08-25 DIAGNOSIS — F331 Major depressive disorder, recurrent, moderate: Secondary | ICD-10-CM

## 2022-08-25 DIAGNOSIS — F4310 Post-traumatic stress disorder, unspecified: Secondary | ICD-10-CM

## 2022-09-01 ENCOUNTER — Ambulatory Visit (INDEPENDENT_AMBULATORY_CARE_PROVIDER_SITE_OTHER): Payer: 59 | Admitting: Licensed Clinical Social Worker

## 2022-09-01 DIAGNOSIS — F4310 Post-traumatic stress disorder, unspecified: Secondary | ICD-10-CM

## 2022-09-01 DIAGNOSIS — F331 Major depressive disorder, recurrent, moderate: Secondary | ICD-10-CM | POA: Diagnosis not present

## 2022-09-10 ENCOUNTER — Ambulatory Visit (INDEPENDENT_AMBULATORY_CARE_PROVIDER_SITE_OTHER): Payer: 59 | Admitting: Licensed Clinical Social Worker

## 2022-09-10 ENCOUNTER — Other Ambulatory Visit: Payer: Self-pay | Admitting: Physician Assistant

## 2022-09-10 DIAGNOSIS — F4310 Post-traumatic stress disorder, unspecified: Secondary | ICD-10-CM

## 2022-09-10 DIAGNOSIS — F331 Major depressive disorder, recurrent, moderate: Secondary | ICD-10-CM

## 2022-09-10 DIAGNOSIS — Z1231 Encounter for screening mammogram for malignant neoplasm of breast: Secondary | ICD-10-CM

## 2022-09-16 ENCOUNTER — Ambulatory Visit (INDEPENDENT_AMBULATORY_CARE_PROVIDER_SITE_OTHER): Payer: 59 | Admitting: Licensed Clinical Social Worker

## 2022-09-16 DIAGNOSIS — F4310 Post-traumatic stress disorder, unspecified: Secondary | ICD-10-CM | POA: Diagnosis not present

## 2022-09-16 DIAGNOSIS — F331 Major depressive disorder, recurrent, moderate: Secondary | ICD-10-CM | POA: Diagnosis not present

## 2022-09-23 ENCOUNTER — Ambulatory Visit: Payer: 59 | Admitting: Licensed Clinical Social Worker

## 2022-09-23 DIAGNOSIS — F331 Major depressive disorder, recurrent, moderate: Secondary | ICD-10-CM

## 2022-09-23 DIAGNOSIS — F4310 Post-traumatic stress disorder, unspecified: Secondary | ICD-10-CM | POA: Diagnosis not present

## 2022-09-28 NOTE — Progress Notes (Signed)
THERAPIST PROGRESS NOTE  Session Time: 8:15AM-8:58AM  Participation Level: Active  Behavioral Response: Casual and Well GroomedAlertAnxious and Depressed  Type of Therapy: Individual Therapy  Treatment Goals addressed:  Reduce overall frequency, intensity and duration of depression so that daily functioning is not impaired per pt self report 3 out of 5 sessions documented.   Reduce overall frequency, intensity and duration of anxiety so that daily functioning is not impaired per pt self report 3 out of 5 sessions.   Loretta Dalton will verbalize an increased sense of mastery over PTSD symptoms by using several techniques to cope with flashbacks, decrease the power of triggers, and decrease negative thinking   ProgressTowards Goals: Progressing  Interventions: CBT, DBT, Strength-based, Reframing, and Other: ACT  Virtual Visit via Video Note  I connected with Loretta Dalton on 09/23/2022 at  8:00 AM EDT by a video enabled telemedicine application and verified that I am speaking with the correct person using two identifiers.  Location: Patient: located in pt home Provider: [facility]   I discussed the limitations of evaluation and management by telemedicine and the availability of in person appointments. The patient expressed understanding and agreed to proceed.  I discussed the assessment and treatment plan with the patient. The patient was provided an opportunity to ask questions and all were answered. The patient agreed with the plan and demonstrated an understanding of the instructions.   The patient was advised to call back or seek an in-person evaluation if the symptoms worsen or if the condition fails to improve as anticipated.  I provided 43 minutes of non-face-to-face time during this encounter.   Loretta Paradise, LCSW  Summary: Loretta Dalton is a 52 y.o. female who presents with mixed symptoms of anxiety and depression related to a history of trauma and experiences with  current stressors. Symptoms endorsed including but not limited to fatigue, negative self affect, irritability, worry, difficulty controlling worry, nervousness, and occasional tearfulness. Patient reported that she also experiences flashbacks from time to time. Patient oriented to person, place, and time. Pt denies SI/HI or A/V hallucinations. Pt was cooperative during visit and was engaged throughout the visit. Pt does not report any other concerns at the time of visit.  Patient reported that she is experiencing happy days more and more lately.   Patient reported that a resurging source of stress is her feelings towards her Dalton. Patient reported that she is proud of her kid and also is still continuing to cope with the fact that her Dalton is different than she thought her Dalton would be. Validated that patient can be proud of her Dalton and also feel difficult emotions regarding changes. Discussed with patient that you can appreciate your kid and grieve your expectations at the same time.   Patient reported that she is concerned from time to time that she is getting worse with her depressive and anxious symptoms. Patient reported that she feels this way due to past stressors seeming to pop up out of nowhere. Informed patient that because she is coming to terms with her bigger stressors in life that her smaller stressors finally have room to be addressed. Discussed the nature of stressors making room for themselves. Encourage patient to continue to stay curious in her problem solving and remember that she is vulnerable to Everyday problems just as much as she is vulnerable to problems informed by trauma. Reminded patient that not every stressor in her life is attributed to trauma and occasionally is a normal part  of life. Invited patient to practice self compassion in the midst of her problem solving and in her growth. Patient reported no additional questions or concerns at this time.   LCSW provided mood  monitoring and treatment progress review in the context of this episode of treatment. LCSW reviewed the Loretta mood status since last session.   Pt is continuing to apply interventions/techniques learned in session into daily life situations. Pt is currently on track to meet goals utilizing interventions that are discussed in session. Treatment to continue as indicated. Personal growth and progress toward goals noted above.  Continued Recommendations as followed: Self-care behaviors, positive social engagements, focusing on positive physical and emotional wellness, and focusing on life/work balance.   Suicidal/Homicidal: Nowithout intent/plan  Therapist Response:  Provided pt education re: acceptance. Discussed how to make acceptance accessible at all parts of Loretta healing journey.   LCSW practiced active listening to validate pt participation, build rapport, and create safe space for pt to feel heard as they are disclosing their thoughts and feelings.   Approached pt with strengths based perspective to assist pt in exploring strengths in moments of feeling low.   LCSW utilized therapeutic conversation skills informed by CBT, DBT, and ACT to expose pt to multiple ways of thinking about healing and to provide pt to access to multiple interventions.  Plan: Return again in 1 week.  Diagnosis: PTSD (post-traumatic stress disorder)  MDD (major depressive disorder), recurrent episode, moderate (HCC)    07/01/2022    8:56 AM 06/03/2022    8:55 AM 02/06/2022    8:39 AM 12/10/2021    3:27 PM  GAD 7 : Generalized Anxiety Score  Nervous, Anxious, on Edge 1 3 3 3   Control/stop worrying 0 1 1 3   Worry too much - different things 0 1 1 3   Trouble relaxing 0 1 1 3   Restless 0 0 0 0  Easily annoyed or irritable 1 2 0 3  Afraid - awful might happen 0 0 0 0  Total GAD 7 Score 2 8 6 15   Anxiety Difficulty Not difficult at all Somewhat difficult Somewhat difficult Very difficult       09/01/2022     2:18 PM 07/01/2022    8:55 AM 06/03/2022    8:53 AM  Depression screen PHQ 2/9  Decreased Interest 1 0 0  Down, Depressed, Hopeless 1 1 1   PHQ - 2 Score 2 1 1   Altered sleeping 1    Tired, decreased energy 1    Change in appetite 0    Feeling bad or failure about yourself  0    Trouble concentrating 1    Moving slowly or fidgety/restless 0    Suicidal thoughts 0    PHQ-9 Score 5      Collaboration of Care: Psychiatrist AEB Dr. Vanetta Shawl  Patient/Guardian was advised Release of Information must be obtained prior to any record release in order to collaborate their care with an outside provider. Patient/Guardian was advised if they have not already done so to contact the registration department to sign all necessary forms in order for [place] to release information regarding their care.   Consent: Patient/Guardian gives verbal consent for treatment and assignment of benefits for services provided during this visit. Patient/Guardian expressed understanding and agreed to proceed.   Loretta Paradise, LCSW 09/28/2022

## 2022-09-30 ENCOUNTER — Ambulatory Visit (INDEPENDENT_AMBULATORY_CARE_PROVIDER_SITE_OTHER): Payer: 59 | Admitting: Licensed Clinical Social Worker

## 2022-09-30 DIAGNOSIS — F4310 Post-traumatic stress disorder, unspecified: Secondary | ICD-10-CM | POA: Diagnosis not present

## 2022-09-30 DIAGNOSIS — F331 Major depressive disorder, recurrent, moderate: Secondary | ICD-10-CM | POA: Diagnosis not present

## 2022-10-03 ENCOUNTER — Ambulatory Visit: Admission: RE | Admit: 2022-10-03 | Payer: 59 | Source: Ambulatory Visit

## 2022-10-03 DIAGNOSIS — Z1231 Encounter for screening mammogram for malignant neoplasm of breast: Secondary | ICD-10-CM | POA: Insufficient documentation

## 2022-10-13 ENCOUNTER — Ambulatory Visit (INDEPENDENT_AMBULATORY_CARE_PROVIDER_SITE_OTHER): Payer: 59 | Admitting: Licensed Clinical Social Worker

## 2022-10-13 DIAGNOSIS — F4310 Post-traumatic stress disorder, unspecified: Secondary | ICD-10-CM

## 2022-10-13 DIAGNOSIS — F331 Major depressive disorder, recurrent, moderate: Secondary | ICD-10-CM | POA: Diagnosis not present

## 2022-10-27 ENCOUNTER — Ambulatory Visit (INDEPENDENT_AMBULATORY_CARE_PROVIDER_SITE_OTHER): Payer: 59 | Admitting: Licensed Clinical Social Worker

## 2022-10-27 DIAGNOSIS — F331 Major depressive disorder, recurrent, moderate: Secondary | ICD-10-CM | POA: Diagnosis not present

## 2022-10-27 DIAGNOSIS — F4310 Post-traumatic stress disorder, unspecified: Secondary | ICD-10-CM

## 2022-10-27 NOTE — Progress Notes (Signed)
THERAPIST PROGRESS NOTE  Session Time: 8:03AM-8:52AM  Participation Level: Active  Behavioral Response: Casual, Neat, and Well GroomedAlertAnxious and Depressed  Type of Therapy: Individual Therapy  Treatment Goals addressed:  Develop and implement effective coping skills to carry out normal responsibilities and participate constructively in relationships as evidenced by self report.   Recall traumatic events without becoming overwhelmed with negative emotions  Reduce overall frequency, intensity and duration of depression so that daily functioning is not impaired per pt self report 3 out of 5 sessions documented.   ProgressTowards Goals: Progressing  Interventions: CBT, DBT, Motivational Interviewing, Strength-based, and Other: ACT  Summary: Loretta Dalton is a 52 y.o. female who presents with mixed sxs of anxiety and depression due to hx of trauma. Sxs endorsed including but not limited to night terrors, insomnia, fatigue, worry, difficulty controlling worry, fearfulness, and lack of motivation. Pt oriented to person, place, and time. Pt denies SI/HI or A/V hallucinations. Pt was cooperative during visit and was engaged throughout the visit. Pt does not report any other concerns at the time of visit.  Pt shared that she has been having weird dreams. Explored with pt topics in dreams. Discussed that emotions will make room to be processed at night before pt sleeps in the form of racing thoughts or in her sleep in the form of dreams. Discussed keeping a notepad beside pt bed where pt can acknowledge thoughts without investigating them. Informed pt that writing thoughts down can allow [place] time in the morning to decide if the thought needs to be investigated and assists pt in keeping track of consistent stressors. Discussed how to be a viewer of thoughts and how to separate self from thoughts and emotions.   Pt named that she has a hard time falling asleep. Encouraged pt to create a  bedtime routine.   Pt shared that she is not talking to her husband about her thoughts and dreams. Pt named that she trusts who her husband is presently but does not trust who he was in the past. Practiced interpersonal skills to assist pt in communicating with husband.   LCSW provided mood monitoring and treatment progress review in the context of this episode of treatment. LCSW reviewed the pt's mood status since last session.   Pt is continuing to apply interventions/techniques learned in session into daily life situations. Pt is currently on track to meet goals utilizing interventions that are discussed in session. Treatment to continue as indicated. Personal growth and progress toward goals noted above.  Continued Recommendations as followed: Self-care behaviors, positive social engagements, focusing on positive physical and emotional wellness, and focusing on life/work balance.    Suicidal/Homicidal: Nowithout intent/plan  Therapist Response:  Provided pt education re: acceptance. Discussed how to make acceptance accessible at all parts of pt's healing journey.   LCSW practiced active listening to validate pt participation, build rapport, and create safe space for pt to feel heard as they are disclosing their thoughts and feelings.   Approached pt with strengths based perspective to assist pt in exploring strengths in moments of feeling low.   LCSW utilized therapeutic conversation skills informed by CBT, DBT, and ACT to expose pt to multiple ways of thinking about healing and to provide pt to access to multiple interventions.  Plan: Return again in 1 weeks.  Diagnosis: PTSD (post-traumatic stress disorder)  MDD (major depressive disorder), recurrent episode, moderate (HCC)    09/30/2022    9:08 AM 07/01/2022    8:56 AM 06/03/2022  8:55 AM 02/06/2022    8:39 AM  GAD 7 : Generalized Anxiety Score  Nervous, Anxious, on Edge 3 1 3 3   Control/stop worrying 1 0 1 1  Worry too much  - different things 1 0 1 1  Trouble relaxing 2 0 1 1  Restless 1 0 0 0  Easily annoyed or irritable 0 1 2 0  Afraid - awful might happen 1 0 0 0  Total GAD 7 Score 9 2 8 6   Anxiety Difficulty Somewhat difficult Not difficult at all Somewhat difficult Somewhat difficult       09/30/2022    9:07 AM 09/01/2022    2:18 PM 07/01/2022    8:55 AM  Depression screen PHQ 2/9  Decreased Interest 0 1 0  Down, Depressed, Hopeless 0 1 1  PHQ - 2 Score 0 2 1  Altered sleeping 0 1   Tired, decreased energy 0 1   Change in appetite 0 0   Feeling bad or failure about yourself  1 0   Trouble concentrating 1 1   Moving slowly or fidgety/restless 0 0   Suicidal thoughts 0 0   PHQ-9 Score 2 5   Difficult doing work/chores Not difficult at all      Collaboration of Care: Other N/A  Patient/Guardian was advised Release of Information must be obtained prior to any record release in order to collaborate their care with an outside provider. Patient/Guardian was advised if they have not already done so to contact the registration department to sign all necessary forms in order for [place] to release information regarding their care.   Consent: Patient/Guardian gives verbal consent for treatment and assignment of benefits for services provided during this visit. Patient/Guardian expressed understanding and agreed to proceed.   Geoffry Paradise, LCSW 10/27/2022

## 2022-10-27 NOTE — Progress Notes (Signed)
THERAPIST PROGRESS NOTE  Session Time: 8:05AM-8:53AM  Participation Level: Active  Behavioral Response: Casual and Well GroomedAlertAnxious and Depressed  Type of Therapy: Individual Therapy  Treatment Goals addressed:  Reduce overall frequency, intensity and duration of depression so that daily functioning is not impaired per pt self report 3 out of 5 sessions documented.   Reduce overall frequency, intensity and duration of anxiety so that daily functioning is not impaired per pt self report 3 out of 5 sessions.   Eleina will verbalize an increased sense of mastery over PTSD symptoms by using several techniques to cope with flashbacks, decrease the power of triggers, and decrease negative thinking   ProgressTowards Goals: Progressing  Interventions: CBT, DBT, Strength-based, Reframing, and Other: ACT  Virtual Visit via Video Note  I connected with Leilanee Moretta Smock on 10/27/2022 at  8:00 AM EDT by a video enabled telemedicine application and verified that I am speaking with the correct person using two identifiers.  Location: Patient: located in pt home Provider: working remotely in [place], [place]   I discussed the limitations of evaluation and management by telemedicine and the availability of in person appointments. The patient expressed understanding and agreed to proceed.  I discussed the assessment and treatment plan with the patient. The patient was provided an opportunity to ask questions and all were answered. The patient agreed with the plan and demonstrated an understanding of the instructions.   The patient was advised to call back or seek an in-person evaluation if the symptoms worsen or if the condition fails to improve as anticipated.  I provided 47 minutes of non-face-to-face time during this encounter.   Geoffry Paradise, LCSW  Summary: Danne Hebb Rohl is a 52 y.o. female who presents with mixed symptoms of anxiety and depression related to a history of  trauma and experiences with current stressors. Symptoms endorsed including but not limited to fatigue, negative self affect, irritability, worry, difficulty controlling worry, nervousness, and occasional tearfulness. Patient reported that she also experiences flashbacks from time to time. Patient oriented to person, place, and time. Pt denies SI/HI or A/V hallucinations. Pt was cooperative during visit and was engaged throughout the visit. Pt does not report any other concerns at the time of visit.  Pt reported that she will return ROI to front desk this week so she can continue care with LCSW for future appts.   Pt utilized therapeutic space to process marital concerns and conflicts that arose on vacation. Pt reported feeling that PTSD is always impacting her life. Discussed how similar feelings does not imply similar situations. Educated pt that something manageable that triggers similar feelings with spouse may bring pt back to the past. Discussed how to grounded in the present. Provided pt processing space to reflect on how to utilize these skills.   LCSW provided mood monitoring and treatment progress review in the context of this episode of treatment. LCSW reviewed the pt's mood status since last session.   Pt is continuing to apply interventions/techniques learned in session into daily life situations. Pt is currently on track to meet goals utilizing interventions that are discussed in session. Treatment to continue as indicated. Personal growth and progress toward goals noted above.  Continued Recommendations as followed: Self-care behaviors, positive social engagements, focusing on positive physical and emotional wellness, and focusing on life/work balance.   Suicidal/Homicidal: Nowithout intent/plan  Therapist Response:  Provided pt education re: acceptance. Discussed how to make acceptance accessible at all parts of pt's healing journey.  LCSW practiced active listening to validate pt  participation, build rapport, and create safe space for pt to feel heard as they are disclosing their thoughts and feelings.   Approached pt with strengths based perspective to assist pt in exploring strengths in moments of feeling low.   LCSW utilized therapeutic conversation skills informed by CBT, DBT, and ACT to expose pt to multiple ways of thinking about healing and to provide pt to access to multiple interventions.  Plan: Pt has requested to transition care to [facility] for all future appts in order to continue receiving therapeutic services from [facility]. Pt was provided with other options for continuing care and reported to continue with [facility] for continuity of care.   Diagnosis: PTSD (post-traumatic stress disorder)  MDD (major depressive disorder), recurrent episode, moderate (HCC)    09/30/2022    9:08 AM 07/01/2022    8:56 AM 06/03/2022    8:55 AM 02/06/2022    8:39 AM  GAD 7 : Generalized Anxiety Score  Nervous, Anxious, on Edge 3 1 3 3   Control/stop worrying 1 0 1 1  Worry too much - different things 1 0 1 1  Trouble relaxing 2 0 1 1  Restless 1 0 0 0  Easily annoyed or irritable 0 1 2 0  Afraid - awful might happen 1 0 0 0  Total GAD 7 Score 9 2 8 6   Anxiety Difficulty Somewhat difficult Not difficult at all Somewhat difficult Somewhat difficult       09/30/2022    9:07 AM 09/01/2022    2:18 PM 07/01/2022    8:55 AM  Depression screen PHQ 2/9  Decreased Interest 0 1 0  Down, Depressed, Hopeless 0 1 1  PHQ - 2 Score 0 2 1  Altered sleeping 0 1   Tired, decreased energy 0 1   Change in appetite 0 0   Feeling bad or failure about yourself  1 0   Trouble concentrating 1 1   Moving slowly or fidgety/restless 0 0   Suicidal thoughts 0 0   PHQ-9 Score 2 5   Difficult doing work/chores Not difficult at all      Collaboration of Care: Psychiatrist AEB Dr. Vanetta Shawl  Patient/Guardian was advised Release of Information must be obtained  prior to any record release in order to collaborate their care with an outside provider. Patient/Guardian was advised if they have not already done so to contact the registration department to sign all necessary forms in order for [place] to release information regarding their care.   Consent: Patient/Guardian gives verbal consent for treatment and assignment of benefits for services provided during this visit. Patient/Guardian expressed understanding and agreed to proceed.   Geoffry Paradise, LCSW 10/27/2022

## 2022-10-27 NOTE — Progress Notes (Signed)
THERAPIST PROGRESS NOTE  Session Time: 8:05AM-8:59AM  Participation Level: Active  Behavioral Response: Casual, Neat, and Well GroomedAlertAnxious and Depressed  Type of Therapy: Individual Therapy  Treatment Goals addressed:  Develop and implement effective coping skills to carry out normal responsibilities and participate constructively in relationships as evidenced by self report.   Recall traumatic events without becoming overwhelmed with negative emotions  Reduce overall frequency, intensity and duration of depression so that daily functioning is not impaired per pt self report 3 out of 5 sessions documented.   ProgressTowards Goals: Progressing  Interventions: CBT, DBT, Motivational Interviewing, Strength-based, and Other: ACT  Summary: Loretta Dalton is a 52 y.o. female who presents with mixed sxs of anxiety and depression due to hx of trauma. Sxs endorsed including but not limited to night terrors, insomnia, fatigue, worry, difficulty controlling worry, fearfulness, and lack of motivation. Pt oriented to person, place, and time. Pt denies SI/HI or A/V hallucinations. Pt was cooperative during visit and was engaged throughout the visit. Pt does not report any other concerns at the time of visit.  Pt shared that she has been having weird dreams. Explored with pt topics in dreams. Discussed that emotions will make room to be processed at night before pt sleeps in the form of racing thoughts or in her sleep in the form of dreams. Discussed keeping a notepad beside pt bed where pt can acknowledge thoughts without investigating them. Informed pt that writing thoughts down can allow [place] time in the morning to decide if the thought needs to be investigated and assists pt in keeping track of consistent stressors. Discussed how to be a viewer of thoughts and how to separate self from thoughts and emotions.   Pt named that she has a hard time falling asleep. Encouraged pt to create a  bedtime routine.   Pt shared that she is not talking to her husband about her thoughts and dreams. Pt named that she trusts who her husband is presently but does not trust who he was in the past. Practiced interpersonal skills to assist pt in communicating with husband.   LCSW provided mood monitoring and treatment progress review in the context of this episode of treatment. LCSW reviewed the pt's mood status since last session.   Pt is continuing to apply interventions/techniques learned in session into daily life situations. Pt is currently on track to meet goals utilizing interventions that are discussed in session. Treatment to continue as indicated. Personal growth and progress toward goals noted above.  Continued Recommendations as followed: Self-care behaviors, positive social engagements, focusing on positive physical and emotional wellness, and focusing on life/work balance.    Suicidal/Homicidal: Nowithout intent/plan  Therapist Response:  Provided pt education re: acceptance. Discussed how to make acceptance accessible at all parts of pt's healing journey.   LCSW practiced active listening to validate pt participation, build rapport, and create safe space for pt to feel heard as they are disclosing their thoughts and feelings.   Approached pt with strengths based perspective to assist pt in exploring strengths in moments of feeling low.   LCSW utilized therapeutic conversation skills informed by CBT, DBT, and ACT to expose pt to multiple ways of thinking about healing and to provide pt to access to multiple interventions.  Plan: Return again in 1 weeks.  Diagnosis: PTSD (post-traumatic stress disorder)  MDD (major depressive disorder), recurrent episode, moderate (HCC)    09/30/2022    9:08 AM 07/01/2022    8:56 AM 06/03/2022  8:55 AM 02/06/2022    8:39 AM  GAD 7 : Generalized Anxiety Score  Nervous, Anxious, on Edge 3 1 3 3   Control/stop worrying 1 0 1 1  Worry too much  - different things 1 0 1 1  Trouble relaxing 2 0 1 1  Restless 1 0 0 0  Easily annoyed or irritable 0 1 2 0  Afraid - awful might happen 1 0 0 0  Total GAD 7 Score 9 2 8 6   Anxiety Difficulty Somewhat difficult Not difficult at all Somewhat difficult Somewhat difficult       09/30/2022    9:07 AM 09/01/2022    2:18 PM 07/01/2022    8:55 AM  Depression screen PHQ 2/9  Decreased Interest 0 1 0  Down, Depressed, Hopeless 0 1 1  PHQ - 2 Score 0 2 1  Altered sleeping 0 1   Tired, decreased energy 0 1   Change in appetite 0 0   Feeling bad or failure about yourself  1 0   Trouble concentrating 1 1   Moving slowly or fidgety/restless 0 0   Suicidal thoughts 0 0   PHQ-9 Score 2 5   Difficult doing work/chores Not difficult at all      Collaboration of Care: Other N/A  Patient/Guardian was advised Release of Information must be obtained prior to any record release in order to collaborate their care with an outside provider. Patient/Guardian was advised if they have not already done so to contact the registration department to sign all necessary forms in order for [place] to release information regarding their care.   Consent: Patient/Guardian gives verbal consent for treatment and assignment of benefits for services provided during this visit. Patient/Guardian expressed understanding and agreed to proceed.   Geoffry Paradise, LCSW 10/27/2022

## 2022-10-28 NOTE — Progress Notes (Signed)
THERAPIST PROGRESS NOTE  Session Time: 8:09AM-8:58AM  Participation Level: Active  Behavioral Response: Casual and Well GroomedAlertAnxious and Depressed  Type of Therapy: Individual Therapy  Treatment Goals addressed:  Reduce overall frequency, intensity and duration of depression so that daily functioning is not impaired per pt self report 3 out of 5 sessions documented.   Reduce overall frequency, intensity and duration of anxiety so that daily functioning is not impaired per pt self report 3 out of 5 sessions.   Loretta Dalton will verbalize an increased sense of mastery over PTSD symptoms by using several techniques to cope with flashbacks, decrease the power of triggers, and decrease negative thinking   ProgressTowards Goals: Progressing  Interventions: CBT, DBT, Strength-based, Reframing, and Other: ACT  Virtual Visit via Video Note  I connected with Loretta Dalton on 10/13/2022 at  8:00 AM EDT by a video enabled telemedicine application and verified that I am speaking with the correct person using two identifiers.  Location: Patient: located in pt home Provider: working remotely in [place]   I discussed the limitations of evaluation and management by telemedicine and the availability of in person appointments. The patient expressed understanding and agreed to proceed.  I discussed the assessment and treatment plan with the patient. The patient was provided an opportunity to ask questions and all were answered. The patient agreed with the plan and demonstrated an understanding of the instructions.   The patient was advised to call back or seek an in-person evaluation if the symptoms worsen or if the condition fails to improve as anticipated.  I provided 49 minutes of non-face-to-face time during this encounter.   Geoffry Paradise, LCSW  Summary: Loretta Dalton is a 52 y.o. female who presents with mixed symptoms of anxiety and depression related to a history of trauma  and experiences with current stressors. Symptoms endorsed including but not limited to fatigue, negative self affect, irritability, worry, difficulty controlling worry, nervousness, and occasional tearfulness. Patient reported that she also experiences flashbacks from time to time. Patient oriented to person, place, and time. Pt denies SI/HI or A/V hallucinations. Pt was cooperative during visit and was engaged throughout the visit. Pt does not report any other concerns at the time of visit.  Patient reported that she is experiencing happy days more and more lately.   Patient reported that a resurging source of stress is her feelings towards her child. Patient reported that she is proud of her kid and also is still continuing to cope with the fact that her child is different than she thought her child would be. Validated that patient can be proud of her child and also feel difficult emotions regarding changes. Discussed with patient that you can appreciate your kid and grieve your expectations at the same time.   Patient reported improvements in mood management and self care since previous appt. Pt identified self care activities and mood boosters.   LCSW provided mood monitoring and treatment progress review in the context of this episode of treatment. LCSW reviewed the pt's mood status since last session.   Pt is continuing to apply interventions/techniques learned in session into daily life situations. Pt is currently on track to meet goals utilizing interventions that are discussed in session. Treatment to continue as indicated. Personal growth and progress toward goals noted above.  Continued Recommendations as followed: Self-care behaviors, positive social engagements, focusing on positive physical and emotional wellness, and focusing on life/work balance.   Suicidal/Homicidal: Nowithout intent/plan  Therapist Response:  Provided pt education re: acceptance. Discussed how to make acceptance  accessible at all parts of pt's healing journey.   LCSW practiced active listening to validate pt participation, build rapport, and create safe space for pt to feel heard as they are disclosing their thoughts and feelings.   Approached pt with strengths based perspective to assist pt in exploring strengths in moments of feeling low.   LCSW utilized therapeutic conversation skills informed by CBT, DBT, and ACT to expose pt to multiple ways of thinking about healing and to provide pt to access to multiple interventions.  Plan: Return again in 1 week. Pt has requested to transition care to [facility] for all future appts in order to continue receiving therapeutic services from [facility]. Pt was provided with other options for continuing care and reported to continue with [facility] for continuity of care.    Diagnosis: PTSD (post-traumatic stress disorder)  MDD (major depressive disorder), recurrent episode, moderate (HCC)    09/30/2022    9:08 AM 07/01/2022    8:56 AM 06/03/2022    8:55 AM 02/06/2022    8:39 AM  GAD 7 : Generalized Anxiety Score  Nervous, Anxious, on Edge 3 1 3 3   Control/stop worrying 1 0 1 1  Worry too much - different things 1 0 1 1  Trouble relaxing 2 0 1 1  Restless 1 0 0 0  Easily annoyed or irritable 0 1 2 0  Afraid - awful might happen 1 0 0 0  Total GAD 7 Score 9 2 8 6   Anxiety Difficulty Somewhat difficult Not difficult at all Somewhat difficult Somewhat difficult       09/30/2022    9:07 AM 09/01/2022    2:18 PM 07/01/2022    8:55 AM  Depression screen PHQ 2/9  Decreased Interest 0 1 0  Down, Depressed, Hopeless 0 1 1  PHQ - 2 Score 0 2 1  Altered sleeping 0 1   Tired, decreased energy 0 1   Change in appetite 0 0   Feeling bad or failure about yourself  1 0   Trouble concentrating 1 1   Moving slowly or fidgety/restless 0 0   Suicidal thoughts 0 0   PHQ-9 Score 2 5   Difficult doing work/chores Not difficult at all       Collaboration of Care: Psychiatrist AEB Dr. Vanetta Shawl  Patient/Guardian was advised Release of Information must be obtained prior to any record release in order to collaborate their care with an outside provider. Patient/Guardian was advised if they have not already done so to contact the registration department to sign all necessary forms in order for [place] to release information regarding their care.   Consent: Patient/Guardian gives verbal consent for treatment and assignment of benefits for services provided during this visit. Patient/Guardian expressed understanding and agreed to proceed.   Geoffry Paradise, LCSW 10/28/2022

## 2022-10-28 NOTE — Progress Notes (Signed)
THERAPIST PROGRESS NOTE  Session Time: 8:05AM-9:03AM  Participation Level: Active  Behavioral Response: Casual, Neat, and Well GroomedAlertAnxious and Depressed  Type of Therapy: Individual Therapy  Treatment Goals addressed:  Develop and implement effective coping skills to carry out normal responsibilities and participate constructively in relationships as evidenced by self report.   Recall traumatic events without becoming overwhelmed with negative emotions  Reduce overall frequency, intensity and duration of depression so that daily functioning is not impaired per pt self report 3 out of 5 sessions documented.   ProgressTowards Goals: Progressing  Interventions: CBT, DBT, Motivational Interviewing, Strength-based, and Other: ACT  Summary: Loretta Dalton is a 52 y.o. female who presents with mixed sxs of anxiety and depression due to hx of trauma. Sxs endorsed including but not limited to night terrors, insomnia, fatigue, worry, difficulty controlling worry, fearfulness, and lack of motivation. Pt oriented to person, place, and time. Pt denies SI/HI or A/V hallucinations. Pt was cooperative during visit and was engaged throughout the visit. Pt does not report any other concerns at the time of visit.  Pt shared that she has been having weird dreams. Explored with pt topics in dreams. Discussed that emotions will make room to be processed at night before pt sleeps in the form of racing thoughts or in her sleep in the form of dreams. Discussed keeping a notepad beside pt bed where pt can acknowledge thoughts without investigating them. Informed pt that writing thoughts down can allow [place] time in the morning to decide if the thought needs to be investigated and assists pt in keeping track of consistent stressors. Discussed how to be a viewer of thoughts and how to separate self from thoughts and emotions.   Pt named that she has a hard time falling asleep. Encouraged pt to create a  bedtime routine.   Pt shared that she is not talking to her husband about her thoughts and dreams. Pt named that she trusts who her husband is presently but does not trust who he was in the past. Practiced interpersonal skills to assist pt in communicating with husband.   LCSW provided mood monitoring and treatment progress review in the context of this episode of treatment. LCSW reviewed the pt's mood status since last session.   Pt is continuing to apply interventions/techniques learned in session into daily life situations. Pt is currently on track to meet goals utilizing interventions that are discussed in session. Treatment to continue as indicated. Personal growth and progress toward goals noted above.  Continued Recommendations as followed: Self-care behaviors, positive social engagements, focusing on positive physical and emotional wellness, and focusing on life/work balance.    Suicidal/Homicidal: Nowithout intent/plan  Therapist Response:  Provided pt education re: acceptance. Discussed how to make acceptance accessible at all parts of pt's healing journey.   LCSW practiced active listening to validate pt participation, build rapport, and create safe space for pt to feel heard as they are disclosing their thoughts and feelings.   Approached pt with strengths based perspective to assist pt in exploring strengths in moments of feeling low.   LCSW utilized therapeutic conversation skills informed by CBT, DBT, and ACT to expose pt to multiple ways of thinking about healing and to provide pt to access to multiple interventions.  Plan: Return again in 1 weeks.  Diagnosis: PTSD (post-traumatic stress disorder)  MDD (major depressive disorder), recurrent episode, moderate (HCC)    09/30/2022    9:08 AM 07/01/2022    8:56 AM 06/03/2022  8:55 AM 02/06/2022    8:39 AM  GAD 7 : Generalized Anxiety Score  Nervous, Anxious, on Edge 3 1 3 3   Control/stop worrying 1 0 1 1  Worry too much  - different things 1 0 1 1  Trouble relaxing 2 0 1 1  Restless 1 0 0 0  Easily annoyed or irritable 0 1 2 0  Afraid - awful might happen 1 0 0 0  Total GAD 7 Score 9 2 8 6   Anxiety Difficulty Somewhat difficult Not difficult at all Somewhat difficult Somewhat difficult       09/30/2022    9:07 AM 09/01/2022    2:18 PM 07/01/2022    8:55 AM  Depression screen PHQ 2/9  Decreased Interest 0 1 0  Down, Depressed, Hopeless 0 1 1  PHQ - 2 Score 0 2 1  Altered sleeping 0 1   Tired, decreased energy 0 1   Change in appetite 0 0   Feeling bad or failure about yourself  1 0   Trouble concentrating 1 1   Moving slowly or fidgety/restless 0 0   Suicidal thoughts 0 0   PHQ-9 Score 2 5   Difficult doing work/chores Not difficult at all      Collaboration of Care: Other N/A  Patient/Guardian was advised Release of Information must be obtained prior to any record release in order to collaborate their care with an outside provider. Patient/Guardian was advised if they have not already done so to contact the registration department to sign all necessary forms in order for [place] to release information regarding their care.   Consent: Patient/Guardian gives verbal consent for treatment and assignment of benefits for services provided during this visit. Patient/Guardian expressed understanding and agreed to proceed.   Memory Dance Loretta Delosreyes, LCSW

## 2022-10-28 NOTE — Progress Notes (Signed)
THERAPIST PROGRESS NOTE  Session Time: 8:15AM-9:00AM  Participation Level: Active  Behavioral Response: Casual, Neat, and Well GroomedAlertAnxious and Depressed  Type of Therapy: Individual Therapy  Treatment Goals addressed:  Develop and implement effective coping skills to carry out normal responsibilities and participate constructively in relationships as evidenced by self report.   Recall traumatic events without becoming overwhelmed with negative emotions  Reduce overall frequency, intensity and duration of depression so that daily functioning is not impaired per pt self report 3 out of 5 sessions documented.   ProgressTowards Goals: Progressing  Interventions: CBT, DBT, Motivational Interviewing, Strength-based, and Other: ACT  Summary: Loretta Dalton is a 52 y.o. female who presents with mixed sxs of anxiety and depression due to hx of trauma. Sxs endorsed including but not limited to night terrors, insomnia, fatigue, worry, difficulty controlling worry, fearfulness, and lack of motivation. Pt oriented to person, place, and time. Pt denies SI/HI or A/V hallucinations. Pt was cooperative during visit and was engaged throughout the visit. Pt does not report any other concerns at the time of visit.  Pt shared that she has been having weird dreams. Explored with pt topics in dreams. Discussed that emotions will make room to be processed at night before pt sleeps in the form of racing thoughts or in her sleep in the form of dreams. Discussed keeping a notepad beside pt bed where pt can acknowledge thoughts without investigating them. Informed pt that writing thoughts down can allow [place] time in the morning to decide if the thought needs to be investigated and assists pt in keeping track of consistent stressors. Discussed how to be a viewer of thoughts and how to separate self from thoughts and emotions.   Pt named that she has a hard time falling asleep. Encouraged pt to create a  bedtime routine.   Pt shared that she is not talking to her husband about her thoughts and dreams. Pt named that she trusts who her husband is presently but does not trust who he was in the past. Practiced interpersonal skills to assist pt in communicating with husband.   LCSW provided mood monitoring and treatment progress review in the context of this episode of treatment. LCSW reviewed the pt's mood status since last session.   Pt is continuing to apply interventions/techniques learned in session into daily life situations. Pt is currently on track to meet goals utilizing interventions that are discussed in session. Treatment to continue as indicated. Personal growth and progress toward goals noted above.  Continued Recommendations as followed: Self-care behaviors, positive social engagements, focusing on positive physical and emotional wellness, and focusing on life/work balance.    Suicidal/Homicidal: Nowithout intent/plan  Therapist Response:  Provided pt education re: acceptance. Discussed how to make acceptance accessible at all parts of pt's healing journey.   LCSW practiced active listening to validate pt participation, build rapport, and create safe space for pt to feel heard as they are disclosing their thoughts and feelings.   Approached pt with strengths based perspective to assist pt in exploring strengths in moments of feeling low.   LCSW utilized therapeutic conversation skills informed by CBT, DBT, and ACT to expose pt to multiple ways of thinking about healing and to provide pt to access to multiple interventions.  Plan: Return again in 1 weeks.  Diagnosis: PTSD (post-traumatic stress disorder)  MDD (major depressive disorder), recurrent episode, moderate (HCC)    09/30/2022    9:08 AM 07/01/2022    8:56 AM 06/03/2022  8:55 AM 02/06/2022    8:39 AM  GAD 7 : Generalized Anxiety Score  Nervous, Anxious, on Edge 3 1 3 3   Control/stop worrying 1 0 1 1  Worry too much  - different things 1 0 1 1  Trouble relaxing 2 0 1 1  Restless 1 0 0 0  Easily annoyed or irritable 0 1 2 0  Afraid - awful might happen 1 0 0 0  Total GAD 7 Score 9 2 8 6   Anxiety Difficulty Somewhat difficult Not difficult at all Somewhat difficult Somewhat difficult       09/30/2022    9:07 AM 09/01/2022    2:18 PM 07/01/2022    8:55 AM  Depression screen PHQ 2/9  Decreased Interest 0 1 0  Down, Depressed, Hopeless 0 1 1  PHQ - 2 Score 0 2 1  Altered sleeping 0 1   Tired, decreased energy 0 1   Change in appetite 0 0   Feeling bad or failure about yourself  1 0   Trouble concentrating 1 1   Moving slowly or fidgety/restless 0 0   Suicidal thoughts 0 0   PHQ-9 Score 2 5   Difficult doing work/chores Not difficult at all      Collaboration of Care: Other N/A  Patient/Guardian was advised Release of Information must be obtained prior to any record release in order to collaborate their care with an outside provider. Patient/Guardian was advised if they have not already done so to contact the registration department to sign all necessary forms in order for [place] to release information regarding their care.   Consent: Patient/Guardian gives verbal consent for treatment and assignment of benefits for services provided during this visit. Patient/Guardian expressed understanding and agreed to proceed.   Memory Dance Dagmawi Venable, LCSW

## 2022-10-28 NOTE — Progress Notes (Signed)
THERAPIST PROGRESS NOTE  Session Time: 8:09AM-8:43AM  Participation Level: Active  Behavioral Response: Casual, Neat, and Well GroomedAlertAnxious and Depressed  Type of Therapy: Individual Therapy  Treatment Goals addressed:  Develop and implement effective coping skills to carry out normal responsibilities and participate constructively in relationships as evidenced by self report.   Recall traumatic events without becoming overwhelmed with negative emotions  Reduce overall frequency, intensity and duration of depression so that daily functioning is not impaired per pt self report 3 out of 5 sessions documented.   ProgressTowards Goals: Progressing  Interventions: CBT, DBT, Motivational Interviewing, Strength-based, and Other: ACT  Virtual Visit via Video Note  I connected with Noehmi Cott Hardcastle on 09/16/2022 at  8:00 AM EDT by a video enabled telemedicine application and verified that I am speaking with the correct person using two identifiers.  Location: Patient: located in pt home Provider: [facility]   I discussed the limitations of evaluation and management by telemedicine and the availability of in person appointments. The patient expressed understanding and agreed to proceed.  I discussed the assessment and treatment plan with the patient. The patient was provided an opportunity to ask questions and all were answered. The patient agreed with the plan and demonstrated an understanding of the instructions.   The patient was advised to call back or seek an in-person evaluation if the symptoms worsen or if the condition fails to improve as anticipated.  I provided 34 minutes of non-face-to-face time during this encounter.   Geoffry Paradise, LCSW  Summary: Loretta Dalton is a 52 y.o. female who presents with mixed sxs of anxiety and depression due to hx of trauma. Sxs endorsed including but not limited to night terrors, insomnia, fatigue, worry, difficulty controlling  worry, fearfulness, and lack of motivation. Pt oriented to person, place, and time. Pt denies SI/HI or A/V hallucinations. Pt was cooperative during visit and was engaged throughout the visit. Pt does not report any other concerns at the time of visit.  Pt shared that she has been having weird dreams. Explored with pt topics in dreams. Discussed that emotions will make room to be processed at night before pt sleeps in the form of racing thoughts or in her sleep in the form of dreams. Discussed keeping a notepad beside pt bed where pt can acknowledge thoughts without investigating them. Informed pt that writing thoughts down can allow [place] time in the morning to decide if the thought needs to be investigated and assists pt in keeping track of consistent stressors. Discussed how to be a viewer of thoughts and how to separate self from thoughts and emotions.   Pt named that she has a hard time falling asleep. Encouraged pt to create a bedtime routine.   Pt shared that she is not talking to her husband about her thoughts and dreams. Pt named that she trusts who her husband is presently but does not trust who he was in the past. Practiced interpersonal skills to assist pt in communicating with husband.   LCSW provided mood monitoring and treatment progress review in the context of this episode of treatment. LCSW reviewed the pt's mood status since last session.   Pt is continuing to apply interventions/techniques learned in session into daily life situations. Pt is currently on track to meet goals utilizing interventions that are discussed in session. Treatment to continue as indicated. Personal growth and progress toward goals noted above.  Continued Recommendations as followed: Self-care behaviors, positive social engagements, focusing on positive  physical and emotional wellness, and focusing on life/work balance.    Suicidal/Homicidal: Nowithout intent/plan  Therapist Response:  Provided pt education  re: acceptance. Discussed how to make acceptance accessible at all parts of pt's healing journey.   LCSW practiced active listening to validate pt participation, build rapport, and create safe space for pt to feel heard as they are disclosing their thoughts and feelings.   Approached pt with strengths based perspective to assist pt in exploring strengths in moments of feeling low.   LCSW utilized therapeutic conversation skills informed by CBT, DBT, and ACT to expose pt to multiple ways of thinking about healing and to provide pt to access to multiple interventions.  Plan: Return again in 1 weeks.  Diagnosis: PTSD (post-traumatic stress disorder)  MDD (major depressive disorder), recurrent episode, moderate (HCC)    09/30/2022    9:08 AM 07/01/2022    8:56 AM 06/03/2022    8:55 AM 02/06/2022    8:39 AM  GAD 7 : Generalized Anxiety Score  Nervous, Anxious, on Edge 3 1 3 3   Control/stop worrying 1 0 1 1  Worry too much - different things 1 0 1 1  Trouble relaxing 2 0 1 1  Restless 1 0 0 0  Easily annoyed or irritable 0 1 2 0  Afraid - awful might happen 1 0 0 0  Total GAD 7 Score 9 2 8 6   Anxiety Difficulty Somewhat difficult Not difficult at all Somewhat difficult Somewhat difficult       09/30/2022    9:07 AM 09/01/2022    2:18 PM 07/01/2022    8:55 AM  Depression screen PHQ 2/9  Decreased Interest 0 1 0  Down, Depressed, Hopeless 0 1 1  PHQ - 2 Score 0 2 1  Altered sleeping 0 1   Tired, decreased energy 0 1   Change in appetite 0 0   Feeling bad or failure about yourself  1 0   Trouble concentrating 1 1   Moving slowly or fidgety/restless 0 0   Suicidal thoughts 0 0   PHQ-9 Score 2 5   Difficult doing work/chores Not difficult at all      Collaboration of Care: Other N/A  Patient/Guardian was advised Release of Information must be obtained prior to any record release in order to collaborate their care with an outside provider. Patient/Guardian was advised if they have  not already done so to contact the registration department to sign all necessary forms in order for [place] to release information regarding their care.   Consent: Patient/Guardian gives verbal consent for treatment and assignment of benefits for services provided during this visit. Patient/Guardian expressed understanding and agreed to proceed.   Memory Dance Niklaus Mamaril, LCSW

## 2022-10-28 NOTE — Progress Notes (Signed)
THERAPIST PROGRESS NOTE  Session Time: 10:02AM-10:52AM  Participation Level: Active  Behavioral Response: Casual, Neat, and Well GroomedAlertAnxious and Depressed  Type of Therapy: Individual Therapy  Treatment Goals addressed:  Develop and implement effective coping skills to carry out normal responsibilities and participate constructively in relationships as evidenced by self report.   Recall traumatic events without becoming overwhelmed with negative emotions  Reduce overall frequency, intensity and duration of depression so that daily functioning is not impaired per pt self report 3 out of 5 sessions documented.   ProgressTowards Goals: Progressing  Interventions: CBT, DBT, Motivational Interviewing, Strength-based, and Other: ACT  Virtual Visit via Video Note  I connected with Loretta Dalton on 08/15/2022 at 10:00 AM EDT by a video enabled telemedicine application and verified that I am speaking with the correct person using two identifiers.  Location: Patient: located in pt home Provider: working remotely in [place], [place]   I discussed the limitations of evaluation and management by telemedicine and the availability of in person appointments. The patient expressed understanding and agreed to proceed.  I discussed the assessment and treatment plan with the patient. The patient was provided an opportunity to ask questions and all were answered. The patient agreed with the plan and demonstrated an understanding of the instructions.   The patient was advised to call back or seek an in-person evaluation if the symptoms worsen or if the condition fails to improve as anticipated.  I provided 50 minutes of non-face-to-face time during this encounter.   Geoffry Paradise, LCSW  Summary: Loretta Dalton is a 52 y.o. female who presents with mixed sxs of anxiety and depression due to hx of trauma. Sxs endorsed including but not limited to night terrors, insomnia, fatigue, worry,  difficulty controlling worry, fearfulness, and lack of motivation. Pt oriented to person, place, and time. Pt denies SI/HI or A/V hallucinations. Pt was cooperative during visit and was engaged throughout the visit. Pt does not report any other concerns at the time of visit.  Pt shared that she has been having weird dreams. Explored with pt topics in dreams. Discussed that emotions will make room to be processed at night before pt sleeps in the form of racing thoughts or in her sleep in the form of dreams. Discussed keeping a notepad beside pt bed where pt can acknowledge thoughts without investigating them. Informed pt that writing thoughts down can allow [place] time in the morning to decide if the thought needs to be investigated and assists pt in keeping track of consistent stressors. Discussed how to be a viewer of thoughts and how to separate self from thoughts and emotions.   Pt named that she has a hard time falling asleep. Encouraged pt to create a bedtime routine.   Pt shared that she is not talking to her husband about her thoughts and dreams. Pt named that she trusts who her husband is presently but does not trust who he was in the past. Practiced interpersonal skills to assist pt in communicating with husband.   LCSW provided mood monitoring and treatment progress review in the context of this episode of treatment. LCSW reviewed the pt's mood status since last session.   Pt is continuing to apply interventions/techniques learned in session into daily life situations. Pt is currently on track to meet goals utilizing interventions that are discussed in session. Treatment to continue as indicated. Personal growth and progress toward goals noted above.  Continued Recommendations as followed: Self-care behaviors, positive social engagements,  focusing on positive physical and emotional wellness, and focusing on life/work balance.    Suicidal/Homicidal: Nowithout intent/plan  Therapist Response:   Provided pt education re: acceptance. Discussed how to make acceptance accessible at all parts of pt's healing journey.   LCSW practiced active listening to validate pt participation, build rapport, and create safe space for pt to feel heard as they are disclosing their thoughts and feelings.   Approached pt with strengths based perspective to assist pt in exploring strengths in moments of feeling low.   LCSW utilized therapeutic conversation skills informed by CBT, DBT, and ACT to expose pt to multiple ways of thinking about healing and to provide pt to access to multiple interventions.  Plan: Return again in 1 weeks.  Diagnosis: PTSD (post-traumatic stress disorder)  MDD (major depressive disorder), recurrent episode, moderate (HCC)    09/30/2022    9:08 AM 07/01/2022    8:56 AM 06/03/2022    8:55 AM 02/06/2022    8:39 AM  GAD 7 : Generalized Anxiety Score  Nervous, Anxious, on Edge 3 1 3 3   Control/stop worrying 1 0 1 1  Worry too much - different things 1 0 1 1  Trouble relaxing 2 0 1 1  Restless 1 0 0 0  Easily annoyed or irritable 0 1 2 0  Afraid - awful might happen 1 0 0 0  Total GAD 7 Score 9 2 8 6   Anxiety Difficulty Somewhat difficult Not difficult at all Somewhat difficult Somewhat difficult       09/30/2022    9:07 AM 09/01/2022    2:18 PM 07/01/2022    8:55 AM  Depression screen PHQ 2/9  Decreased Interest 0 1 0  Down, Depressed, Hopeless 0 1 1  PHQ - 2 Score 0 2 1  Altered sleeping 0 1   Tired, decreased energy 0 1   Change in appetite 0 0   Feeling bad or failure about yourself  1 0   Trouble concentrating 1 1   Moving slowly or fidgety/restless 0 0   Suicidal thoughts 0 0   PHQ-9 Score 2 5   Difficult doing work/chores Not difficult at all      Collaboration of Care: Other N/A  Patient/Guardian was advised Release of Information must be obtained prior to any record release in order to collaborate their care with an outside provider. Patient/Guardian  was advised if they have not already done so to contact the registration department to sign all necessary forms in order for [place] to release information regarding their care.   Consent: Patient/Guardian gives verbal consent for treatment and assignment of benefits for services provided during this visit. Patient/Guardian expressed understanding and agreed to proceed.   Loretta Dance Jasin Brazel, LCSW

## 2022-10-28 NOTE — Progress Notes (Signed)
THERAPIST PROGRESS NOTE  Session Time: 8:05AM-8:50AM  Participation Level: Active  Behavioral Response: Casual and Well GroomedAlertAnxious and Depressed  Type of Therapy: Individual Therapy  Treatment Goals addressed:  Reduce overall frequency, intensity and duration of depression so that daily functioning is not impaired per pt self report 3 out of 5 sessions documented.   Reduce overall frequency, intensity and duration of anxiety so that daily functioning is not impaired per pt self report 3 out of 5 sessions.   Loretta Dalton will verbalize an increased sense of mastery over PTSD symptoms by using several techniques to cope with flashbacks, decrease the power of triggers, and decrease negative thinking   ProgressTowards Goals: Progressing  Interventions: CBT, DBT, Strength-based, Reframing, and Other: ACT  Summary: Loretta Dalton is a 52 y.o. female who presents with mixed symptoms of anxiety and depression related to a history of trauma and experiences with current stressors. Symptoms endorsed including but not limited to fatigue, negative self affect, irritability, worry, difficulty controlling worry, nervousness, and occasional tearfulness. Patient reported that she also experiences flashbacks from time to time. Patient oriented to person, place, and time. Pt denies SI/HI or A/V hallucinations. Pt was cooperative during visit and was engaged throughout the visit. Pt does not report any other concerns at the time of visit.  Patient reported that she is experiencing happy days more and more lately.   Patient reported that a resurging source of stress is her feelings towards her child. Patient reported that she is proud of her kid and also is still continuing to cope with the fact that her child is different than she thought her child would be. Validated that patient can be proud of her child and also feel difficult emotions regarding changes. Discussed with patient that you can  appreciate your kid and grieve your expectations at the same time.   Patient reported improvements in mood management and self care since previous appt. Pt identified self care activities and mood boosters.   LCSW provided mood monitoring and treatment progress review in the context of this episode of treatment. LCSW reviewed the pt's mood status since last session.   Pt is continuing to apply interventions/techniques learned in session into daily life situations. Pt is currently on track to meet goals utilizing interventions that are discussed in session. Treatment to continue as indicated. Personal growth and progress toward goals noted above.  Continued Recommendations as followed: Self-care behaviors, positive social engagements, focusing on positive physical and emotional wellness, and focusing on life/work balance.   Suicidal/Homicidal: Nowithout intent/plan  Therapist Response:  Provided pt education re: acceptance. Discussed how to make acceptance accessible at all parts of pt's healing journey.   LCSW practiced active listening to validate pt participation, build rapport, and create safe space for pt to feel heard as they are disclosing their thoughts and feelings.   Approached pt with strengths based perspective to assist pt in exploring strengths in moments of feeling low.   LCSW utilized therapeutic conversation skills informed by CBT, DBT, and ACT to expose pt to multiple ways of thinking about healing and to provide pt to access to multiple interventions.  Plan: Return again in 1 week. Pt has requested to transition care to [facility] for all future appts in order to continue receiving therapeutic services from [facility]. Pt was provided with other options for continuing care and reported to continue with [facility] for continuity of care.    Diagnosis: PTSD (post-traumatic stress disorder)  MDD (  major depressive disorder), recurrent episode,  moderate (HCC)    09/30/2022    9:08 AM 07/01/2022    8:56 AM 06/03/2022    8:55 AM 02/06/2022    8:39 AM  GAD 7 : Generalized Anxiety Score  Nervous, Anxious, on Edge 3 1 3 3   Control/stop worrying 1 0 1 1  Worry too much - different things 1 0 1 1  Trouble relaxing 2 0 1 1  Restless 1 0 0 0  Easily annoyed or irritable 0 1 2 0  Afraid - awful might happen 1 0 0 0  Total GAD 7 Score 9 2 8 6   Anxiety Difficulty Somewhat difficult Not difficult at all Somewhat difficult Somewhat difficult       09/30/2022    9:07 AM 09/01/2022    2:18 PM 07/01/2022    8:55 AM  Depression screen PHQ 2/9  Decreased Interest 0 1 0  Down, Depressed, Hopeless 0 1 1  PHQ - 2 Score 0 2 1  Altered sleeping 0 1   Tired, decreased energy 0 1   Change in appetite 0 0   Feeling bad or failure about yourself  1 0   Trouble concentrating 1 1   Moving slowly or fidgety/restless 0 0   Suicidal thoughts 0 0   PHQ-9 Score 2 5   Difficult doing work/chores Not difficult at all      Collaboration of Care: Psychiatrist AEB Dr. Vanetta Shawl  Patient/Guardian was advised Release of Information must be obtained prior to any record release in order to collaborate their care with an outside provider. Patient/Guardian was advised if they have not already done so to contact the registration department to sign all necessary forms in order for [place] to release information regarding their care.   Consent: Patient/Guardian gives verbal consent for treatment and assignment of benefits for services provided during this visit. Patient/Guardian expressed understanding and agreed to proceed.   Geoffry Paradise, LCSW 10/28/2022

## 2022-10-28 NOTE — Progress Notes (Signed)
THERAPIST PROGRESS NOTE  Session Time: 8:02AM-8:58AM  Participation Level: Active  Behavioral Response: Casual, Neat, and Well GroomedAlertAnxious and Depressed  Type of Therapy: Individual Therapy  Treatment Goals addressed:  Develop and implement effective coping skills to carry out normal responsibilities and participate constructively in relationships as evidenced by self report.   Recall traumatic events without becoming overwhelmed with negative emotions  Reduce overall frequency, intensity and duration of depression so that daily functioning is not impaired per pt self report 3 out of 5 sessions documented.   ProgressTowards Goals: Progressing  Interventions: CBT, DBT, Motivational Interviewing, Strength-based, and Other: ACT  Virtual Visit via Video Note  I connected with Loretta Dalton on 08/25/2022 at  8:00 AM EDT by a video enabled telemedicine application and verified that I am speaking with the correct person using two identifiers.  Location: Patient: located in pt home Provider: [facility]   I discussed the limitations of evaluation and management by telemedicine and the availability of in person appointments. The patient expressed understanding and agreed to proceed.  I discussed the assessment and treatment plan with the patient. The patient was provided an opportunity to ask questions and all were answered. The patient agreed with the plan and demonstrated an understanding of the instructions.   The patient was advised to call back or seek an in-person evaluation if the symptoms worsen or if the condition fails to improve as anticipated.  I provided 56 minutes of non-face-to-face time during this encounter.   Geoffry Paradise, LCSW  Summary: Loretta Dalton is a 52 y.o. female who presents with mixed sxs of anxiety and depression due to hx of trauma. Sxs endorsed including but not limited to night terrors, insomnia, fatigue, worry, difficulty controlling worry,  fearfulness, and lack of motivation. Pt oriented to person, place, and time. Pt denies SI/HI or A/V hallucinations. Pt was cooperative during visit and was engaged throughout the visit. Pt does not report any other concerns at the time of visit.  Pt shared that she has been having weird dreams. Explored with pt topics in dreams. Discussed that emotions will make room to be processed at night before pt sleeps in the form of racing thoughts or in her sleep in the form of dreams. Discussed keeping a notepad beside pt bed where pt can acknowledge thoughts without investigating them. Informed pt that writing thoughts down can allow [place] time in the morning to decide if the thought needs to be investigated and assists pt in keeping track of consistent stressors. Discussed how to be a viewer of thoughts and how to separate self from thoughts and emotions.   Pt named that she has a hard time falling asleep. Encouraged pt to create a bedtime routine.   Pt shared that she is not talking to her husband about her thoughts and dreams. Pt named that she trusts who her husband is presently but does not trust who he was in the past. Practiced interpersonal skills to assist pt in communicating with husband.   LCSW provided mood monitoring and treatment progress review in the context of this episode of treatment. LCSW reviewed the pt's mood status since last session.   Pt is continuing to apply interventions/techniques learned in session into daily life situations. Pt is currently on track to meet goals utilizing interventions that are discussed in session. Treatment to continue as indicated. Personal growth and progress toward goals noted above.  Continued Recommendations as followed: Self-care behaviors, positive social engagements, focusing on positive  physical and emotional wellness, and focusing on life/work balance.    Suicidal/Homicidal: Nowithout intent/plan  Therapist Response:  Provided pt education re:  acceptance. Discussed how to make acceptance accessible at all parts of pt's healing journey.   LCSW practiced active listening to validate pt participation, build rapport, and create safe space for pt to feel heard as they are disclosing their thoughts and feelings.   Approached pt with strengths based perspective to assist pt in exploring strengths in moments of feeling low.   LCSW utilized therapeutic conversation skills informed by CBT, DBT, and ACT to expose pt to multiple ways of thinking about healing and to provide pt to access to multiple interventions.  Plan: Return again in 1 weeks.  Diagnosis: PTSD (post-traumatic stress disorder)  MDD (major depressive disorder), recurrent episode, moderate (HCC)    09/30/2022    9:08 AM 07/01/2022    8:56 AM 06/03/2022    8:55 AM 02/06/2022    8:39 AM  GAD 7 : Generalized Anxiety Score  Nervous, Anxious, on Edge 3 1 3 3   Control/stop worrying 1 0 1 1  Worry too much - different things 1 0 1 1  Trouble relaxing 2 0 1 1  Restless 1 0 0 0  Easily annoyed or irritable 0 1 2 0  Afraid - awful might happen 1 0 0 0  Total GAD 7 Score 9 2 8 6   Anxiety Difficulty Somewhat difficult Not difficult at all Somewhat difficult Somewhat difficult       09/30/2022    9:07 AM 09/01/2022    2:18 PM 07/01/2022    8:55 AM  Depression screen PHQ 2/9  Decreased Interest 0 1 0  Down, Depressed, Hopeless 0 1 1  PHQ - 2 Score 0 2 1  Altered sleeping 0 1   Tired, decreased energy 0 1   Change in appetite 0 0   Feeling bad or failure about yourself  1 0   Trouble concentrating 1 1   Moving slowly or fidgety/restless 0 0   Suicidal thoughts 0 0   PHQ-9 Score 2 5   Difficult doing work/chores Not difficult at all      Collaboration of Care: Other N/A  Patient/Guardian was advised Release of Information must be obtained prior to any record release in order to collaborate their care with an outside provider. Patient/Guardian was advised if they have not  already done so to contact the registration department to sign all necessary forms in order for [place] to release information regarding their care.   Consent: Patient/Guardian gives verbal consent for treatment and assignment of benefits for services provided during this visit. Patient/Guardian expressed understanding and agreed to proceed.   Memory Dance Myha Arizpe, LCSW

## 2022-10-28 NOTE — Progress Notes (Signed)
THERAPIST PROGRESS NOTE  Session Time: 8:05AM-8:59AM  Participation Level: Active  Behavioral Response: Casual, Neat, and Well GroomedAlertAnxious and Depressed  Type of Therapy: Individual Therapy  Treatment Goals addressed:  Develop and implement effective coping skills to carry out normal responsibilities and participate constructively in relationships as evidenced by self report.   Recall traumatic events without becoming overwhelmed with negative emotions  Reduce overall frequency, intensity and duration of depression so that daily functioning is not impaired per pt self report 3 out of 5 sessions documented.   ProgressTowards Goals: Progressing  Interventions: CBT, DBT, Motivational Interviewing, Strength-based, and Other: ACT  Summary: Annalis Siekierski Arora is a 52 y.o. female who presents with mixed sxs of anxiety and depression due to hx of trauma. Sxs endorsed including but not limited to night terrors, insomnia, fatigue, worry, difficulty controlling worry, fearfulness, and lack of motivation. Pt oriented to person, place, and time. Pt denies SI/HI or A/V hallucinations. Pt was cooperative during visit and was engaged throughout the visit. Pt does not report any other concerns at the time of visit.  Pt shared that she has been having weird dreams. Explored with pt topics in dreams. Discussed that emotions will make room to be processed at night before pt sleeps in the form of racing thoughts or in her sleep in the form of dreams. Discussed keeping a notepad beside pt bed where pt can acknowledge thoughts without investigating them. Informed pt that writing thoughts down can allow [place] time in the morning to decide if the thought needs to be investigated and assists pt in keeping track of consistent stressors. Discussed how to be a viewer of thoughts and how to separate self from thoughts and emotions.   Pt named that she has a hard time falling asleep. Encouraged pt to create a  bedtime routine.   Pt shared that she is not talking to her husband about her thoughts and dreams. Pt named that she trusts who her husband is presently but does not trust who he was in the past. Practiced interpersonal skills to assist pt in communicating with husband.   LCSW provided mood monitoring and treatment progress review in the context of this episode of treatment. LCSW reviewed the pt's mood status since last session.   Pt is continuing to apply interventions/techniques learned in session into daily life situations. Pt is currently on track to meet goals utilizing interventions that are discussed in session. Treatment to continue as indicated. Personal growth and progress toward goals noted above.  Continued Recommendations as followed: Self-care behaviors, positive social engagements, focusing on positive physical and emotional wellness, and focusing on life/work balance.    Suicidal/Homicidal: Nowithout intent/plan  Therapist Response:  Provided pt education re: acceptance. Discussed how to make acceptance accessible at all parts of pt's healing journey.   LCSW practiced active listening to validate pt participation, build rapport, and create safe space for pt to feel heard as they are disclosing their thoughts and feelings.   Approached pt with strengths based perspective to assist pt in exploring strengths in moments of feeling low.   LCSW utilized therapeutic conversation skills informed by CBT, DBT, and ACT to expose pt to multiple ways of thinking about healing and to provide pt to access to multiple interventions.  Plan: Return again in 1 weeks.  Diagnosis: PTSD (post-traumatic stress disorder)  MDD (major depressive disorder), recurrent episode, moderate (HCC)    09/30/2022    9:08 AM 07/01/2022    8:56 AM 06/03/2022  8:55 AM 02/06/2022    8:39 AM  GAD 7 : Generalized Anxiety Score  Nervous, Anxious, on Edge 3 1 3 3   Control/stop worrying 1 0 1 1  Worry too much  - different things 1 0 1 1  Trouble relaxing 2 0 1 1  Restless 1 0 0 0  Easily annoyed or irritable 0 1 2 0  Afraid - awful might happen 1 0 0 0  Total GAD 7 Score 9 2 8 6   Anxiety Difficulty Somewhat difficult Not difficult at all Somewhat difficult Somewhat difficult       09/30/2022    9:07 AM 09/01/2022    2:18 PM 07/01/2022    8:55 AM  Depression screen PHQ 2/9  Decreased Interest 0 1 0  Down, Depressed, Hopeless 0 1 1  PHQ - 2 Score 0 2 1  Altered sleeping 0 1   Tired, decreased energy 0 1   Change in appetite 0 0   Feeling bad or failure about yourself  1 0   Trouble concentrating 1 1   Moving slowly or fidgety/restless 0 0   Suicidal thoughts 0 0   PHQ-9 Score 2 5   Difficult doing work/chores Not difficult at all      Collaboration of Care: Other N/A  Patient/Guardian was advised Release of Information must be obtained prior to any record release in order to collaborate their care with an outside provider. Patient/Guardian was advised if they have not already done so to contact the registration department to sign all necessary forms in order for [place] to release information regarding their care.   Consent: Patient/Guardian gives verbal consent for treatment and assignment of benefits for services provided during this visit. Patient/Guardian expressed understanding and agreed to proceed.   Memory Dance Javonta Gronau, LCSW

## 2023-04-20 ENCOUNTER — Other Ambulatory Visit: Payer: Self-pay | Admitting: Otolaryngology

## 2023-04-20 DIAGNOSIS — H9042 Sensorineural hearing loss, unilateral, left ear, with unrestricted hearing on the contralateral side: Secondary | ICD-10-CM

## 2023-05-18 ENCOUNTER — Encounter: Payer: Self-pay | Admitting: Otolaryngology

## 2023-05-22 ENCOUNTER — Ambulatory Visit
Admission: RE | Admit: 2023-05-22 | Discharge: 2023-05-22 | Disposition: A | Discharge status: Home / Self Care | Payer: Self-pay | Source: Ambulatory Visit | Attending: Otolaryngology | Admitting: Otolaryngology

## 2023-05-22 DIAGNOSIS — H9042 Sensorineural hearing loss, unilateral, left ear, with unrestricted hearing on the contralateral side: Secondary | ICD-10-CM

## 2023-05-22 MED ORDER — GADOPICLENOL 0.5 MMOL/ML IV SOLN
10.0000 mL | Freq: Once | INTRAVENOUS | Status: AC | PRN
  Administered 2023-05-22: 10 mL via INTRAVENOUS

## 2023-09-15 ENCOUNTER — Other Ambulatory Visit: Payer: Self-pay | Admitting: Physician Assistant

## 2023-09-15 DIAGNOSIS — Z1231 Encounter for screening mammogram for malignant neoplasm of breast: Secondary | ICD-10-CM

## 2023-10-09 ENCOUNTER — Ambulatory Visit
Admission: RE | Admit: 2023-10-09 | Discharge: 2023-10-09 | Disposition: A | Discharge status: Home / Self Care | Source: Ambulatory Visit | Attending: Physician Assistant | Admitting: Physician Assistant

## 2023-10-09 DIAGNOSIS — Z1231 Encounter for screening mammogram for malignant neoplasm of breast: Secondary | ICD-10-CM | POA: Insufficient documentation
